# Patient Record
Sex: Male | Born: 1975 | Race: Black or African American | Hispanic: No | Marital: Married | Smoking: Former smoker
Health system: Southern US, Community
[De-identification: ages and names within clinical notes are randomized; demographics above are authoritative.]

## PROBLEM LIST (undated history)

## (undated) DIAGNOSIS — I10 Essential (primary) hypertension: Secondary | ICD-10-CM

## (undated) DIAGNOSIS — G43909 Migraine, unspecified, not intractable, without status migrainosus: Secondary | ICD-10-CM

## (undated) HISTORY — DX: Essential (primary) hypertension: I10

## (undated) HISTORY — DX: Migraine, unspecified, not intractable, without status migrainosus: G43.909

## (undated) HISTORY — PX: NO PAST SURGERIES: SHX2092

---

## 1998-01-26 ENCOUNTER — Emergency Department (HOSPITAL_COMMUNITY): Admission: EM | Admit: 1998-01-26 | Discharge: 1998-01-26 | Payer: Self-pay | Admitting: Emergency Medicine

## 1999-12-08 ENCOUNTER — Emergency Department (HOSPITAL_COMMUNITY): Admission: EM | Admit: 1999-12-08 | Discharge: 1999-12-08 | Payer: Self-pay

## 2012-06-11 ENCOUNTER — Ambulatory Visit: Payer: Self-pay | Admitting: Internal Medicine

## 2012-06-11 DIAGNOSIS — Z0289 Encounter for other administrative examinations: Secondary | ICD-10-CM

## 2013-02-06 ENCOUNTER — Ambulatory Visit (INDEPENDENT_AMBULATORY_CARE_PROVIDER_SITE_OTHER): Payer: BC Managed Care – PPO | Admitting: Internal Medicine

## 2013-02-06 ENCOUNTER — Other Ambulatory Visit (INDEPENDENT_AMBULATORY_CARE_PROVIDER_SITE_OTHER): Payer: BC Managed Care – PPO

## 2013-02-06 ENCOUNTER — Encounter: Payer: Self-pay | Admitting: Internal Medicine

## 2013-02-06 VITALS — BP 136/82 | HR 66 | Temp 98.0°F | Wt 179.0 lb

## 2013-02-06 DIAGNOSIS — Z131 Encounter for screening for diabetes mellitus: Secondary | ICD-10-CM

## 2013-02-06 DIAGNOSIS — Z0000 Encounter for general adult medical examination without abnormal findings: Secondary | ICD-10-CM

## 2013-02-06 DIAGNOSIS — R7989 Other specified abnormal findings of blood chemistry: Secondary | ICD-10-CM

## 2013-02-06 DIAGNOSIS — Z130 Encounter for screening for diseases of the blood and blood-forming organs and certain disorders involving the immune mechanism: Secondary | ICD-10-CM

## 2013-02-06 DIAGNOSIS — Z13220 Encounter for screening for lipoid disorders: Secondary | ICD-10-CM

## 2013-02-06 DIAGNOSIS — Z23 Encounter for immunization: Secondary | ICD-10-CM

## 2013-02-06 LAB — COMPREHENSIVE METABOLIC PANEL
ALT: 27 U/L (ref 0–53)
AST: 29 U/L (ref 0–37)
Alkaline Phosphatase: 45 U/L (ref 39–117)
BUN: 13 mg/dL (ref 6–23)
Creatinine, Ser: 1.1 mg/dL (ref 0.4–1.5)
Total Bilirubin: 0.4 mg/dL (ref 0.3–1.2)

## 2013-02-06 LAB — CBC
Hemoglobin: 15.6 g/dL (ref 13.0–17.0)
MCHC: 33.5 g/dL (ref 30.0–36.0)
MCV: 91.1 fl (ref 78.0–100.0)
Platelets: 229 10*3/uL (ref 150.0–400.0)
RDW: 12.8 % (ref 11.5–14.6)

## 2013-02-06 LAB — LIPID PANEL
HDL: 44.7 mg/dL (ref >39.00)
Total CHOL/HDL Ratio: 5
Triglycerides: 243 mg/dL — ABNORMAL HIGH (ref 0.0–149.0)
VLDL: 48.6 mg/dL — ABNORMAL HIGH (ref 0.0–40.0)

## 2013-02-06 LAB — HEMOGLOBIN A1C: Hgb A1c MFr Bld: 6.1 % (ref 4.6–6.5)

## 2013-02-06 NOTE — Patient Instructions (Signed)
Health Maintenance, Males A healthy lifestyle and preventative care can promote health and wellness.  Maintain regular health, dental, and eye exams.  Eat a healthy diet. Foods like vegetables, fruits, whole grains, low-fat dairy products, and lean protein foods contain the nutrients you need without too many calories. Decrease your intake of foods high in solid fats, added sugars, and salt. Get information about a proper diet from your caregiver, if necessary.  Regular physical exercise is one of the most important things you can do for your health. Most adults should get at least 150 minutes of moderate-intensity exercise (any activity that increases your heart rate and causes you to sweat) each week. In addition, most adults need muscle-strengthening exercises on 2 or more days a week.   Maintain a healthy weight. The body mass index (BMI) is a screening tool to identify possible weight problems. It provides an estimate of body fat based on height and weight. Your caregiver can help determine your BMI, and can help you achieve or maintain a healthy weight. For adults 20 years and older:  A BMI below 18.5 is considered underweight.  A BMI of 18.5 to 24.9 is normal.  A BMI of 25 to 29.9 is considered overweight.  A BMI of 30 and above is considered obese.  Maintain normal blood lipids and cholesterol by exercising and minimizing your intake of saturated fat. Eat a balanced diet with plenty of fruits and vegetables. Blood tests for lipids and cholesterol should begin at age 20 and be repeated every 5 years. If your lipid or cholesterol levels are high, you are over 50, or you are a high risk for heart disease, you may need your cholesterol levels checked more frequently.Ongoing high lipid and cholesterol levels should be treated with medicines, if diet and exercise are not effective.  If you smoke, find out from your caregiver how to quit. If you do not use tobacco, do not start.  If you  choose to drink alcohol, do not exceed 2 drinks per day. One drink is considered to be 12 ounces (355 mL) of beer, 5 ounces (148 mL) of wine, or 1.5 ounces (44 mL) of liquor.  Avoid use of street drugs. Do not share needles with anyone. Ask for help if you need support or instructions about stopping the use of drugs.  High blood pressure causes heart disease and increases the risk of stroke. Blood pressure should be checked at least every 1 to 2 years. Ongoing high blood pressure should be treated with medicines if weight loss and exercise are not effective.  If you are 45 to 38 years old, ask your caregiver if you should take aspirin to prevent heart disease.  Diabetes screening involves taking a blood sample to check your fasting blood sugar level. This should be done once every 3 years, after age 45, if you are within normal weight and without risk factors for diabetes. Testing should be considered at a younger age or be carried out more frequently if you are overweight and have at least 1 risk factor for diabetes.  Colorectal cancer can be detected and often prevented. Most routine colorectal cancer screening begins at the age of 50 and continues through age 75. However, your caregiver may recommend screening at an earlier age if you have risk factors for colon cancer. On a yearly basis, your caregiver may provide home test kits to check for hidden blood in the stool. Use of a small camera at the end of a tube,   to directly examine the colon (sigmoidoscopy or colonoscopy), can detect the earliest forms of colorectal cancer. Talk to your caregiver about this at age 9, when routine screening begins. Direct examination of the colon should be repeated every 5 to 10 years through age 63, unless early forms of pre-cancerous polyps or small growths are found.  Hepatitis C blood testing is recommended for all people born from [birth year] through 1966 and any individual with known risks for hepatitis C.  Healthy  men should no longer receive prostate-specific antigen (PSA) blood tests as part of routine cancer screening. Consult with your caregiver about prostate cancer screening.  Testicular cancer screening is not recommended for adolescents or adult males who have no symptoms. Screening includes self-exam, caregiver exam, and other screening tests. Consult with your caregiver about any symptoms you have or any concerns you have about testicular cancer.  Practice safe sex. Use condoms and avoid high-risk sexual practices to reduce the spread of sexually transmitted infections (STIs).  Use sunscreen with a sun protection factor (SPF) of 30 or greater. Apply sunscreen liberally and repeatedly throughout the day. You should seek shade when your shadow is shorter than you. Protect yourself by wearing long sleeves, pants, a wide-brimmed hat, and sunglasses year round, whenever you are outdoors.  Notify your caregiver of new moles or changes in moles, especially if there is a change in shape or color. Also notify your caregiver if a mole is larger than the size of a pencil eraser.  A one-time screening for abdominal aortic aneurysm (AAA) and surgical repair of large AAAs by sound wave imaging (ultrasonography) is recommended for ages 26 to 61 years who are current or former smokers.  Stay current with your immunizations. Document Released: 12/31/2007 Document Revised: 09/26/2011 Document Reviewed: 11/29/2010 [facility] Patient Information 2014 [place], [place]. Self-Exam Of The Testicles Young men ages 64-35 are the ones who most commonly get cancer of the testicles. About 300 young men die of this disease each year. Testicular cancer does occur in middle-aged or older men but to a lesser extent. This cancer can almost always be cured if it is found before it gets bad and if it is treated. WORDS TO KNOW:  Testicles are the 2 egg-shaped glands that make hormones and sperm in men.  The scrotum is the skin around  testicles.  Epididymis is the rope-like part that is behind and above each testis. It collects sperm made by the testis. WHICH MEN ARE AT HIGH RISK FOR CANCER OF THE TESTICLES?  Men between ages 54 to 6.  Men who are Caucasian.  Men who were born with a testicle that had not moved down into the scrotum.  Men whose testicles have gotten smaller because of an infection.  Men whose fathers or brothers have had cancer of the testicles. SYMPTOMS OF TESTICULAR CANCER  A painless swelling in one of your testicles.  A hard lump. Some lumps may be an infection.  A heavy feeling in your testicles.  An ache in your lower belly (abdomen) or groin. HOW OFTEN SHOULD I CHECK MY TESTICLES? You should check your testicles every month. HOW SHOULD I DO THIS CHECK? It is best to check your testicles right after a warm shower or bath.  Look at your testicles for any swelling. You may need to use a mirror.  Use both your hands to roll each testicle between your thumb and fingers. Feel for any lumps or changes in the size of the testicle. Press firmly. You  may find that one testicle is a little bigger than the other. This is normal.  Next, check the epididymis on each testicle. This is the part where most testicular cancers happen. It is normal for the epididymis to feel soft and uneven. When you get used to how your epididymis feels, you will be able to tell if there is any change.  WHAT IF I FIND ANY SWELLINGS OR LUMPS?  Call your doctor. Some lumps may be an infection. If the lump or swelling is a cancer, it can be treated before it gets worse.  Document Released: 09/30/2008 Document Revised: 09/26/2011 Document Reviewed: 09/30/2008 [facility] Patient Information 2014 [place], [place].

## 2013-02-06 NOTE — Addendum Note (Signed)
Addended by: Edwena Felty T on: 02/06/2013 10:02 AM   Modules accepted: Orders

## 2013-02-06 NOTE — Progress Notes (Signed)
HPI  Pt presents to the clinic today to establish care. He has not seen a doctor in about 10 years. He would like screening blood work today. Other than that he has no concerns.  Flu: never Tetanus: more than 10 years ago Eye doctor: as needed Dentist: yearly   History reviewed. No pertinent past medical history.  No current outpatient prescriptions on file.   No current facility-administered medications for this visit.    Not on File  Family History  Problem Relation Age of Onset  . Hypertension Mother   . Heart disease Paternal Uncle   . Diabetes Paternal Uncle   . Stroke Paternal Uncle   . Arthritis Maternal Grandmother   . Cancer Paternal Grandmother     breast    History   Social History  . Marital Status: Single    Spouse Name: N/A    Number of Children: N/A  . Years of Education: N/A   Occupational History  . Not on file.   Social History Main Topics  . Smoking status: Current Some Day Smoker    Types: Cigarettes  . Smokeless tobacco: Not on file  . Alcohol Use: 10.8 oz/week    18 Cans of beer per week  . Drug Use: No  . Sexually Active: Yes    Birth Control/ Protection: Condom   Other Topics Concern  . Not on file   Social History Narrative  . No narrative on file    ROS:  Constitutional: Denies fever, malaise, fatigue, headache or abrupt weight changes.  HEENT: Denies eye pain, eye redness, ear pain, ringing in the ears, wax buildup, runny nose, nasal congestion, bloody nose, or sore throat. Respiratory: Denies difficulty breathing, shortness of breath, cough or sputum production.   Cardiovascular: Denies chest pain, chest tightness, palpitations or swelling in the hands or feet.  Gastrointestinal: Denies abdominal pain, bloating, constipation, diarrhea or blood in the stool.  GU: Denies frequency, urgency, pain with urination, blood in urine, odor or discharge. Musculoskeletal: Denies decrease in range of motion, difficulty with gait, muscle  pain or joint pain and swelling.  Skin: Denies redness, rashes, lesions or ulcercations.  Neurological: Denies dizziness, difficulty with memory, difficulty with speech or problems with balance and coordination.   No other specific complaints in a complete review of systems (except as listed in HPI above).  PE:  BP 136/82  Pulse 66  Temp(Src) 98 F (36.7 C) (Oral)  Wt 179 lb (81.194 kg)  SpO2 97% Wt Readings from Last 3 Encounters:  02/06/13 179 lb (81.194 kg)    General: Appears his stated age, well developed, well nourished in NAD. HEENT: Head: normal shape and size; Eyes: sclera white, no icterus, conjunctiva pink, PERRLA and EOMs intact; Ears: Tm's gray and intact, normal light reflex; Nose: mucosa pink and moist, septum midline; Throat/Mouth: Teeth present, mucosa pink and moist, no lesions or ulcerations noted.  Neck: Normal range of motion. Neck supple, trachea midline. No massses, lumps or thyromegaly present.  Cardiovascular: Normal rate and rhythm. S1,S2 noted.  No murmur, rubs or gallops noted. No JVD or BLE edema. No carotid bruits noted. Pulmonary/Chest: Normal effort and positive vesicular breath sounds. No respiratory distress. No wheezes, rales or ronchi noted.  Abdomen: Soft and nontender. Normal bowel sounds, no bruits noted. No distention or masses noted. Liver, spleen and kidneys non palpable. Musculoskeletal: Normal range of motion. No signs of joint swelling. No difficulty with gait.  Neurological: Alert and oriented. Cranial nerves II-XII intact. Coordination  normal. +DTRs bilaterally. Psychiatric: Mood and affect normal. Behavior is normal. Judgment and thought content normal.      Assessment and Plan:  Preventative Health Maintenance:  Will give tdap today Discussed importance of getting annual flu shot Smoking cessation counseling given, material provided Will obtain screening labs  RTC in 1 year or sooner if needed

## 2013-02-07 ENCOUNTER — Telehealth: Payer: Self-pay | Admitting: *Deleted

## 2013-02-07 NOTE — Telephone Encounter (Signed)
Pt called requesting results.  Advised him of results as per result note.

## 2013-02-08 ENCOUNTER — Encounter: Payer: Self-pay | Admitting: *Deleted

## 2015-07-08 ENCOUNTER — Ambulatory Visit: Payer: Self-pay | Admitting: Family

## 2015-07-27 ENCOUNTER — Ambulatory Visit: Payer: Self-pay | Admitting: Family

## 2015-08-18 ENCOUNTER — Other Ambulatory Visit (INDEPENDENT_AMBULATORY_CARE_PROVIDER_SITE_OTHER): Payer: BLUE CROSS/BLUE SHIELD

## 2015-08-18 ENCOUNTER — Ambulatory Visit (INDEPENDENT_AMBULATORY_CARE_PROVIDER_SITE_OTHER): Payer: BLUE CROSS/BLUE SHIELD | Admitting: Family

## 2015-08-18 ENCOUNTER — Encounter: Payer: Self-pay | Admitting: Family

## 2015-08-18 VITALS — BP 136/84 | HR 69 | Temp 97.8°F | Resp 16 | Ht 74.0 in | Wt 191.0 lb

## 2015-08-18 DIAGNOSIS — Z0000 Encounter for general adult medical examination without abnormal findings: Secondary | ICD-10-CM

## 2015-08-18 DIAGNOSIS — Z720 Tobacco use: Secondary | ICD-10-CM | POA: Diagnosis not present

## 2015-08-18 DIAGNOSIS — F17209 Nicotine dependence, unspecified, with unspecified nicotine-induced disorders: Secondary | ICD-10-CM | POA: Insufficient documentation

## 2015-08-18 DIAGNOSIS — Z716 Tobacco abuse counseling: Secondary | ICD-10-CM | POA: Diagnosis not present

## 2015-08-18 LAB — COMPREHENSIVE METABOLIC PANEL
ALBUMIN: 4.5 g/dL (ref 3.5–5.2)
ALT: 28 U/L (ref 0–53)
AST: 29 U/L (ref 0–37)
Alkaline Phosphatase: 41 U/L (ref 39–117)
BUN: 13 mg/dL (ref 6–23)
CALCIUM: 9.8 mg/dL (ref 8.4–10.5)
CHLORIDE: 101 meq/L (ref 96–112)
CO2: 30 mEq/L (ref 19–32)
CREATININE: 1.07 mg/dL (ref 0.40–1.50)
GFR: 97.97 mL/min (ref >60.00)
Glucose, Bld: 103 mg/dL — ABNORMAL HIGH (ref 70–99)
POTASSIUM: 4.2 meq/L (ref 3.5–5.1)
Sodium: 136 mEq/L (ref 135–145)
Total Bilirubin: 0.4 mg/dL (ref 0.2–1.2)
Total Protein: 7.5 g/dL (ref 6.0–8.3)

## 2015-08-18 LAB — LIPID PANEL
CHOL/HDL RATIO: 4
Cholesterol: 192 mg/dL (ref 0–200)
HDL: 44.6 mg/dL (ref >39.00)
LDL CALC: 115 mg/dL — AB (ref 0–99)
NonHDL: 147.37
TRIGLYCERIDES: 162 mg/dL — AB (ref 0.0–149.0)
VLDL: 32.4 mg/dL (ref 0.0–40.0)

## 2015-08-18 LAB — CBC
HEMATOCRIT: 45.5 % (ref 39.0–52.0)
Hemoglobin: 15 g/dL (ref 13.0–17.0)
MCHC: 33 g/dL (ref 30.0–36.0)
MCV: 89.8 fl (ref 78.0–100.0)
PLATELETS: 242 10*3/uL (ref 150.0–400.0)
RBC: 5.07 Mil/uL (ref 4.22–5.81)
RDW: 13.1 % (ref 11.5–15.5)
WBC: 7.7 10*3/uL (ref 4.0–10.5)

## 2015-08-18 LAB — PSA: PSA: 1.06 ng/mL (ref 0.10–4.00)

## 2015-08-18 LAB — TSH: TSH: 0.77 u[IU]/mL (ref 0.35–4.50)

## 2015-08-18 NOTE — Patient Instructions (Signed)
Thank you for choosing [facility].  Summary/Instructions:  Please stop by the lab on the basement level of the building for your blood work. Your results will be released to [place] (or called to you) after review, usually within 72 hours after test completion. If any changes need to be made, you will be notified at that same time.  Health Maintenance, Male A healthy lifestyle and preventative care can promote health and wellness.  Maintain regular health, dental, and eye exams.  Eat a healthy diet. Foods like vegetables, fruits, whole grains, low-fat dairy products, and lean protein foods contain the nutrients you need and are low in calories. Decrease your intake of foods high in solid fats, added sugars, and salt. Get information about a proper diet from your health care provider, if necessary.  Regular physical exercise is one of the most important things you can do for your health. Most adults should get at least 150 minutes of moderate-intensity exercise (any activity that increases your heart rate and causes you to sweat) each week. In addition, most adults need muscle-strengthening exercises on 2 or more days a week.   Maintain a healthy weight. The body mass index (BMI) is a screening tool to identify possible weight problems. It provides an estimate of body fat based on height and weight. Your health care provider can find your BMI and can help you achieve or maintain a healthy weight. For males 20 years and older:  A BMI below 18.5 is considered underweight.  A BMI of 18.5 to 24.9 is normal.  A BMI of 25 to 29.9 is considered overweight.  A BMI of 30 and above is considered obese.  Maintain normal blood lipids and cholesterol by exercising and minimizing your intake of saturated fat. Eat a balanced diet with plenty of fruits and vegetables. Blood tests for lipids and cholesterol should begin at age 34 and be repeated every 5 years. If your lipid or cholesterol levels are  high, you are over age 50, or you are at high risk for heart disease, you may need your cholesterol levels checked more frequently.Ongoing high lipid and cholesterol levels should be treated with medicines if diet and exercise are not working.  If you smoke, find out from your health care provider how to quit. If you do not use tobacco, do not start.  Lung cancer screening is recommended for adults aged 34-80 years who are at high risk for developing lung cancer because of a history of smoking. A yearly low-dose CT scan of the lungs is recommended for people who have at least a 30-pack-year history of smoking and are current smokers or have quit within the past 15 years. A pack year of smoking is smoking an average of 1 pack of cigarettes a day for 1 year (for example, a 30-pack-year history of smoking could mean smoking 1 pack a day for 30 years or 2 packs a day for 15 years). Yearly screening should continue until the smoker has stopped smoking for at least 15 years. Yearly screening should be stopped for people who develop a health problem that would prevent them from having lung cancer treatment.  If you choose to drink alcohol, do not have more than 2 drinks per day. One drink is considered to be 12 oz (360 mL) of beer, 5 oz (150 mL) of wine, or 1.5 oz (45 mL) of liquor.  Avoid the use of street drugs. Do not share needles with anyone. Ask for help if you need  support or instructions about stopping the use of drugs.  High blood pressure causes heart disease and increases the risk of stroke. High blood pressure is more likely to develop in:  People who have blood pressure in the end of the normal range (100-139/85-89 mm Hg).  People who are overweight or obese.  People who are African American.  If you are 2-65 years of age, have your blood pressure checked every 3-5 years. If you are 45 years of age or older, have your blood pressure checked every year. You should have your blood pressure  measured twice--once when you are at a hospital or clinic, and once when you are not at a hospital or clinic. Record the average of the two measurements. To check your blood pressure when you are not at a hospital or clinic, you can use:  An automated blood pressure machine at a pharmacy.  A home blood pressure monitor.  If you are 37-22 years old, ask your health care provider if you should take aspirin to prevent heart disease.  Diabetes screening involves taking a blood sample to check your fasting blood sugar level. This should be done once every 3 years after age 42 if you are at a normal weight and without risk factors for diabetes. Testing should be considered at a younger age or be carried out more frequently if you are overweight and have at least 1 risk factor for diabetes.  Colorectal cancer can be detected and often prevented. Most routine colorectal cancer screening begins at the age of 46 and continues through age 38. However, your health care provider may recommend screening at an earlier age if you have risk factors for colon cancer. On a yearly basis, your health care provider may provide home test kits to check for hidden blood in the stool. A small camera at the end of a tube may be used to directly examine the colon (sigmoidoscopy or colonoscopy) to detect the earliest forms of colorectal cancer. Talk to your health care provider about this at age 42 when routine screening begins. A direct exam of the colon should be repeated every 5-10 years through age 19, unless early forms of precancerous polyps or small growths are found.  People who are at an increased risk for hepatitis B should be screened for this virus. You are considered at high risk for hepatitis B if:  You were born in a country where hepatitis B occurs often. Talk with your health care provider about which countries are considered high risk.  Your parents were born in a high-risk country and you have not received a  shot to protect against hepatitis B (hepatitis B vaccine).  You have HIV or AIDS.  You use needles to inject street drugs.  You live with, or have sex with, someone who has hepatitis B.  You are a man who has sex with other men (MSM).  You get hemodialysis treatment.  You take certain medicines for conditions like cancer, organ transplantation, and autoimmune conditions.  Hepatitis C blood testing is recommended for all people born from [birth year] through 1966 and any individual with known risk factors for hepatitis C.  Healthy men should no longer receive prostate-specific antigen (PSA) blood tests as part of routine cancer screening. Talk to your health care provider about prostate cancer screening.  Testicular cancer screening is not recommended for adolescents or adult males who have no symptoms. Screening includes self-exam, a health care provider exam, and other screening tests. Consult with your  health care provider about any symptoms you have or any concerns you have about testicular cancer.  Practice safe sex. Use condoms and avoid high-risk sexual practices to reduce the spread of sexually transmitted infections (STIs).  You should be screened for STIs, including gonorrhea and chlamydia if:  You are sexually active and are younger than 24 years.  You are older than 24 years, and your health care provider tells you that you are at risk for this type of infection.  Your sexual activity has changed since you were last screened, and you are at an increased risk for chlamydia or gonorrhea. Ask your health care provider if you are at risk.  If you are at risk of being infected with HIV, it is recommended that you take a prescription medicine daily to prevent HIV infection. This is called pre-exposure prophylaxis (PrEP). You are considered at risk if:  You are a man who has sex with other men (MSM).  You are a heterosexual man who is sexually active with multiple partners.  You take  drugs by injection.  You are sexually active with a partner who has HIV.  Talk with your health care provider about whether you are at high risk of being infected with HIV. If you choose to begin PrEP, you should first be tested for HIV. You should then be tested every 3 months for as long as you are taking PrEP.  Use sunscreen. Apply sunscreen liberally and repeatedly throughout the day. You should seek shade when your shadow is shorter than you. Protect yourself by wearing long sleeves, pants, a wide-brimmed hat, and sunglasses year round whenever you are outdoors.  Tell your health care provider of new moles or changes in moles, especially if there is a change in shape or color. Also, tell your health care provider if a mole is larger than the size of a pencil eraser.  A one-time screening for abdominal aortic aneurysm (AAA) and surgical repair of large AAAs by ultrasound is recommended for men aged 44-75 years who are current or former smokers.  Stay current with your vaccines (immunizations).   This information is not intended to replace advice given to you by your health care provider. Make sure you discuss any questions you have with your health care provider.   Document Released: 12/31/2007 Document Revised: 07/25/2014 Document Reviewed: 11/29/2010 Elsevier Interactive Patient Education 2016 [employer].  Smoking Cessation, Tips for Success If you are ready to quit smoking, congratulations! You have chosen to help yourself be healthier. Cigarettes bring nicotine, tar, carbon monoxide, and other irritants into your body. Your lungs, heart, and blood vessels will be able to work better without these poisons. There are many different ways to quit smoking. Nicotine gum, nicotine patches, a nicotine inhaler, or nicotine nasal spray can help with physical craving. Hypnosis, support groups, and medicines help break the habit of smoking. WHAT THINGS CAN I DO TO MAKE QUITTING EASIER?  Here are  some tips to help you quit for good:  Pick a date when you will quit smoking completely. Tell all of your friends and family about your plan to quit on that date.  Do not try to slowly cut down on the number of cigarettes you are smoking. Pick a quit date and quit smoking completely starting on that day.  Throw away all cigarettes.   Clean and remove all ashtrays from your home, work, and car.  On a card, write down your reasons for quitting. Carry the card with  you and read it when you get the urge to smoke.  Cleanse your body of nicotine. Drink enough water and fluids to keep your urine clear or pale yellow. Do this after quitting to flush the nicotine from your body.  Learn to predict your moods. Do not let a bad situation be your excuse to have a cigarette. Some situations in your life might tempt you into wanting a cigarette.  Never have "just one" cigarette. It leads to wanting another and another. Remind yourself of your decision to quit.  Change habits associated with smoking. If you smoked while driving or when feeling stressed, try other activities to replace smoking. Stand up when drinking your coffee. Brush your teeth after eating. Sit in a different chair when you read the paper. Avoid alcohol while trying to quit, and try to drink fewer caffeinated beverages. Alcohol and caffeine may urge you to smoke.  Avoid foods and drinks that can trigger a desire to smoke, such as sugary or spicy foods and alcohol.  Ask people who smoke not to smoke around you.  Have something planned to do right after eating or having a cup of coffee. For example, plan to take a walk or exercise.  Try a relaxation exercise to calm you down and decrease your stress. Remember, you may be tense and nervous for the first 2 weeks after you quit, but this will pass.  Find new activities to keep your hands busy. Play with a pen, coin, or rubber band. Doodle or draw things on paper.  Brush your teeth right  after eating. This will help cut down on the craving for the taste of tobacco after meals. You can also try mouthwash.   Use oral substitutes in place of cigarettes. Try using lemon drops, carrots, cinnamon sticks, or chewing gum. Keep them handy so they are available when you have the urge to smoke.  When you have the urge to smoke, try deep breathing.  Designate your home as a nonsmoking area.  If you are a heavy smoker, ask your health care provider about a prescription for nicotine chewing gum. It can ease your withdrawal from nicotine.  Reward yourself. Set aside the cigarette money you save and buy yourself something nice.  Look for support from others. Join a support group or smoking cessation program. Ask someone at home or at work to help you with your plan to quit smoking.  Always ask yourself, "Do I need this cigarette or is this just a reflex?" Tell yourself, "Today, I choose not to smoke," or "I do not want to smoke." You are reminding yourself of your decision to quit.  Do not replace cigarette smoking with electronic cigarettes (commonly called e-cigarettes). The safety of e-cigarettes is unknown, and some may contain harmful chemicals.  If you relapse, do not give up! Plan ahead and think about what you will do the next time you get the urge to smoke. HOW WILL I FEEL WHEN I QUIT SMOKING? You may have symptoms of withdrawal because your body is used to nicotine (the addictive substance in cigarettes). You may crave cigarettes, be irritable, feel very hungry, cough often, get headaches, or have difficulty concentrating. The withdrawal symptoms are only temporary. They are strongest when you first quit but will go away within 10-14 days. When withdrawal symptoms occur, stay in control. Think about your reasons for quitting. Remind yourself that these are signs that your body is healing and getting used to being without cigarettes. Remember that  withdrawal symptoms are easier to  treat than the major diseases that smoking can cause.  Even after the withdrawal is over, expect periodic urges to smoke. However, these cravings are generally short lived and will go away whether you smoke or not. Do not smoke! WHAT RESOURCES ARE AVAILABLE TO HELP ME QUIT SMOKING? Your health care provider can direct you to community resources or hospitals for support, which may include:  Group support.  Education.  Hypnosis.  Therapy.   This information is not intended to replace advice given to you by your health care provider. Make sure you discuss any questions you have with your health care provider.   Document Released: 04/01/2004 Document Revised: 07/25/2014 Document Reviewed: 12/20/2012 Elsevier Interactive Patient Education 2016 [employer].  Steps to Quit Smoking  Smoking tobacco can be harmful to your health and can affect almost every organ in your body. Smoking puts you, and those around you, at risk for developing many serious chronic diseases. Quitting smoking is difficult, but it is one of the best things that you can do for your health. It is never too late to quit. WHAT ARE THE BENEFITS OF QUITTING SMOKING? When you quit smoking, you lower your risk of developing serious diseases and conditions, such as:  Lung cancer or lung disease, such as COPD.  Heart disease.  Stroke.  Heart attack.  Infertility.  Osteoporosis and bone fractures. Additionally, symptoms such as coughing, wheezing, and shortness of breath may get better when you quit. You may also find that you get sick less often because your body is stronger at fighting off colds and infections. If you are pregnant, quitting smoking can help to reduce your chances of having a baby of low birth weight. HOW DO I GET READY TO QUIT? When you decide to quit smoking, create a plan to make sure that you are successful. Before you quit:  Pick a date to quit. Set a date within the next two weeks to give you time  to prepare.  Write down the reasons why you are quitting. Keep this list in places where you will see it often, such as on your bathroom mirror or in your car or wallet.  Identify the people, places, things, and activities that make you want to smoke (triggers) and avoid them. Make sure to take these actions:  Throw away all cigarettes at home, at work, and in your car.  Throw away smoking accessories, such as Scientist, research (medical).  Clean your car and make sure to empty the ashtray.  Clean your home, including curtains and carpets.  Tell your family, friends, and coworkers that you are quitting. Support from your loved ones can make quitting easier.  Talk with your health care provider about your options for quitting smoking.  Find out what treatment options are covered by your health insurance. WHAT STRATEGIES CAN I USE TO QUIT SMOKING?  Talk with your healthcare provider about different strategies to quit smoking. Some strategies include:  Quitting smoking altogether instead of gradually lessening how much you smoke over a period of time. Research shows that quitting "cold [place]" is more successful than gradually quitting.  Attending in-person counseling to help you build problem-solving skills. You are more likely to have success in quitting if you attend several counseling sessions. Even short sessions of 10 minutes can be effective.  Finding resources and support systems that can help you to quit smoking and remain smoke-free after you quit. These resources are most helpful when you  use them often. They can include:  Online chats with a Social worker.  Telephone quitlines.  Printed Furniture conservator/restorer.  Support groups or group counseling.  Text messaging programs.  Mobile phone applications.  Taking medicines to help you quit smoking. (If you are pregnant or breastfeeding, talk with your health care provider first.) Some medicines contain nicotine and some do not. Both  types of medicines help with cravings, but the medicines that include nicotine help to relieve withdrawal symptoms. Your health care provider may recommend:  Nicotine patches, gum, or lozenges.  Nicotine inhalers or sprays.  Non-nicotine medicine that is taken by mouth. Talk with your health care provider about combining strategies, such as taking medicines while you are also receiving in-person counseling. Using these two strategies together makes you more likely to succeed in quitting than if you used either strategy on its own. If you are pregnant or breastfeeding, talk with your health care provider about finding counseling or other support strategies to quit smoking. Do not take medicine to help you quit smoking unless told to do so by your health care provider. WHAT THINGS CAN I DO TO MAKE IT EASIER TO QUIT? Quitting smoking might feel overwhelming at first, but there is a lot that you can do to make it easier. Take these important actions:  Reach out to your family and friends and ask that they support and encourage you during this time. Call telephone quitlines, reach out to support groups, or work with a counselor for support.  Ask people who smoke to avoid smoking around you.  Avoid places that trigger you to smoke, such as bars, parties, or smoke-break areas at work.  Spend time around people who do not smoke.  Lessen stress in your life, because stress can be a smoking trigger for some people. To lessen stress, try:  Exercising regularly.  Deep-breathing exercises.  Yoga.  Meditating.  Performing a body scan. This involves closing your eyes, scanning your body from head to toe, and noticing which parts of your body are particularly tense. Purposefully relax the muscles in those areas.  Download or purchase mobile phone or tablet apps (applications) that can help you stick to your quit plan by providing reminders, tips, and encouragement. There are many free apps, such as  QuitGuide from the [employer] Office manager for Disease Control and Prevention). You can find other support for quitting smoking (smoking cessation) through smokefree.gov and other websites. HOW WILL I FEEL WHEN I QUIT SMOKING? Within the first 24 hours of quitting smoking, you may start to feel some withdrawal symptoms. These symptoms are usually most noticeable 2-3 days after quitting, but they usually do not last beyond 2-3 weeks. Changes or symptoms that you might experience include:  Mood swings.  Restlessness, anxiety, or irritation.  Difficulty concentrating.  Dizziness.  Strong cravings for sugary foods in addition to nicotine.  Mild weight gain.  Constipation.  Nausea.  Coughing or a sore throat.  Changes in how your medicines work in your body.  A depressed mood.  Difficulty sleeping (insomnia). After the first 2-3 weeks of quitting, you may start to notice more positive results, such as:  Improved sense of smell and taste.  Decreased coughing and sore throat.  Slower heart rate.  Lower blood pressure.  Clearer skin.  The ability to breathe more easily.  Fewer sick days. Quitting smoking is very challenging for most people. Do not get discouraged if you are not successful the first time. Some people need to make many  attempts to quit before they achieve long-term success. Do your best to stick to your quit plan, and talk with your health care provider if you have any questions or concerns.   This information is not intended to replace advice given to you by your health care provider. Make sure you discuss any questions you have with your health care provider.   Document Released: 06/28/2001 Document Revised: 11/18/2014 Document Reviewed: 11/18/2014 Elsevier Interactive Patient Education [employer].

## 2015-08-18 NOTE — Progress Notes (Signed)
Pre visit review using our clinic review tool, if applicable. No additional management support is needed unless otherwise documented below in the visit note. 

## 2015-08-18 NOTE — Assessment & Plan Note (Signed)
1) Anticipatory Guidance: Discussed importance of wearing a seatbelt while driving and not texting while driving; changing batteries in smoke detector at least once annually; wearing suntan lotion when outside; eating a balanced and moderate diet; getting physical activity at least 30 minutes per day.  2) Immunizations / Screenings / Labs:  Declines flu shot. All other immunizations are up-to-date per recommendations. Due for a vision screen encouraged to be completed independently. Due for a prostate screening secondary to first-degree relative with prostate cancer. Obtain PSA. All other screenings are up-to-date per recommendations. Obtain CBC, CMET, Lipid profile and TSH.   Overall well exam with risk factors for cardiovascular disease including continues tobacco use and increased alcohol intake. Discussed tobacco cessation, resources, and methods use for tobacco cessation including gums, patches, and medications. Counseled for approximately 5 minutes regarding tobacco cessation. Patient will contemplate cessation. Emphasize importance of continued intake of a moderate, varied, and balanced nutritional intake that emphasizes nutritionally dense foods. Recommend increasing physical activity to 30 minutes daily outside of work. He is of good body weight. Continue healthy lifestyle choices and behaviors. Follow-up prevention exam in 1 year. Follow-up office visit pending blood work results.

## 2015-08-18 NOTE — Assessment & Plan Note (Signed)
Discussed tobacco cessation, resources, and methods use for tobacco cessation including gums, patches, and medications. Counseled for approximately 5 minutes regarding tobacco cessation. Patient will contemplate cessation.

## 2015-08-18 NOTE — Progress Notes (Signed)
Subjective:    Patient ID: Donald Glass, male    DOB: October 03, 1975, 40 y.o.   MRN: [id number]  Chief Complaint  Patient presents with  . Establish Care    CPE, not fasting    HPI:  Donald Glass is a 40 y.o. male who presents today for an annual wellness visit.   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    2) Preventative Exams / Immunizations:  Dental -- Up to date  Vision -- Due for exam    Health Maintenance  Topic Date Due  . HIV Screening  09/05/1989  . INFLUENZA VACCINE  04/01/2016 (Originally 02/16/2015)  . TETANUS/TDAP  02/07/2023    Immunization History  Administered Date(s) Administered  . Tdap 02/06/2013   No Known Allergies   No outpatient prescriptions prior to visit.   No facility-administered medications prior to visit.     History reviewed. No pertinent past medical history.   History reviewed. No pertinent past surgical history.   Family History  Problem Relation Age of Onset  . Hypertension Mother   . Heart disease Paternal Uncle   . Diabetes Paternal Uncle   . Stroke Paternal Uncle   . Arthritis Maternal Grandmother   . Cancer Paternal Grandmother     breast  . Prostate cancer Father      Social History   Social History  . Marital Status: Married    Spouse Name: N/A  . Number of Children: 4  . Years of Education: 14   Occupational History  . Quality Technician     Social History Main Topics  . Smoking status: Current Some Day Smoker -- 1.00 packs/day for 20 years    Types: Cigarettes  . Smokeless tobacco: Never Used  . Alcohol Use: 10.8 oz/week    18 Cans of beer per week  . Drug Use: 1.00 per week    Special: Marijuana  . Sexual Activity: Yes    Birth Control/ Protection: Condom   Other Topics Concern    . Not on file   Social History Narrative   Fun: Watch football, movies, video games    Review of Systems  Constitutional: Denies fever, chills, fatigue, or significant weight gain/loss. HENT: Head: Denies headache or neck pain Ears: Denies changes in hearing, ringing in ears, earache, drainage Nose: Denies discharge, stuffiness, itching, nosebleed, sinus pain Throat: Denies sore throat, hoarseness, dry mouth, sores, thrush Eyes: Denies loss/changes in vision, pain, redness, blurry/double vision, flashing lights Cardiovascular: Denies chest pain/discomfort, tightness, palpitations, shortness of breath with activity, difficulty lying down, swelling, sudden awakening with shortness of breath Respiratory: Denies shortness of breath, cough, sputum production, wheezing Gastrointestinal: Denies dysphasia, heartburn, change in appetite, nausea, change in bowel habits, rectal bleeding, constipation, diarrhea, yellow skin or eyes Genitourinary: Denies frequency, urgency, burning/pain, blood in urine, incontinence, change in urinary strength. Musculoskeletal: Denies muscle/joint pain, stiffness, back pain, redness or swelling of joints, trauma Skin: Denies rashes, lumps, itching, dryness, color changes, or hair/nail changes Neurological: Denies dizziness, fainting, seizures, weakness, numbness, tingling, tremor Psychiatric - Denies nervousness, stress, depression or memory loss Endocrine: Denies heat or cold intolerance, sweating, frequent urination, excessive thirst, changes in appetite Hematologic: Denies ease of bruising or bleeding     Objective:    BP 136/84 mmHg  Pulse 69  Temp(Src) 97.8 F (36.6 C) (Oral)  Resp 16  Ht 6' 2"  (1.88 m)  Wt 191 lb (86.637 kg)  BMI 24.51 kg/m2  SpO2 97% Nursing note and vital signs reviewed.  Physical Exam  Constitutional: He is oriented to person, place, and time. He appears well-developed and well-nourished.  HENT:  Head: Normocephalic.  Right  Ear: Hearing, tympanic membrane, external ear and ear canal normal.  Left Ear: Hearing, tympanic membrane, external ear and ear canal normal.  Nose: Nose normal.  Mouth/Throat: Uvula is midline, oropharynx is clear and moist and mucous membranes are normal.  Eyes: Conjunctivae and EOM are normal. Pupils are equal, round, and reactive to light.  Neck: Neck supple. No JVD present. No tracheal deviation present. No thyromegaly present.  Cardiovascular: Normal rate, regular rhythm, normal heart sounds and intact distal pulses.   Pulmonary/Chest: Effort normal and breath sounds normal.  Abdominal: Soft. Bowel sounds are normal. He exhibits no distension and no mass. There is no tenderness. There is no rebound and no guarding.  Musculoskeletal: Normal range of motion. He exhibits no edema or tenderness.  Lymphadenopathy:    He has no cervical adenopathy.  Neurological: He is alert and oriented to person, place, and time. He has normal reflexes. No cranial nerve deficit. He exhibits normal muscle tone. Coordination normal.  Skin: Skin is warm and dry.  Psychiatric: He has a normal mood and affect. His behavior is normal. Judgment and thought content normal.       Assessment & Plan:   Problem List Items Addressed This Visit      Other   Routine general medical examination at a health care facility - Primary    1) Anticipatory Guidance: Discussed importance of wearing a seatbelt while driving and not texting while driving; changing batteries in smoke detector at least once annually; wearing suntan lotion when outside; eating a balanced and moderate diet; getting physical activity at least 30 minutes per day.  2) Immunizations / Screenings / Labs:  Declines flu shot. All other immunizations are up-to-date per recommendations. Due for a vision screen encouraged to be completed independently. Due for a prostate screening secondary to first-degree relative with prostate cancer. Obtain PSA. All other  screenings are up-to-date per recommendations. Obtain CBC, CMET, Lipid profile and TSH.   Overall well exam with risk factors for cardiovascular disease including continues tobacco use and increased alcohol intake. Discussed tobacco cessation, resources, and methods use for tobacco cessation including gums, patches, and medications. Counseled for approximately 5 minutes regarding tobacco cessation. Patient will contemplate cessation. Emphasize importance of continued intake of a moderate, varied, and balanced nutritional intake that emphasizes nutritionally dense foods. Recommend increasing physical activity to 30 minutes daily outside of work. He is of good body weight. Continue healthy lifestyle choices and behaviors. Follow-up prevention exam in 1 year. Follow-up office visit pending blood work results.       Relevant Orders   Comprehensive metabolic panel (Completed)   CBC (Completed)   Lipid panel (Completed)   TSH (Completed)   PSA (Completed)   Tobacco use disorder, continuous    Discussed tobacco cessation, resources, and methods use for tobacco cessation including gums, patches, and medications. Counseled for approximately 5 minutes regarding tobacco cessation. Patient will contemplate cessation.       Other Visit Diagnoses    Tobacco abuse counseling

## 2015-08-19 ENCOUNTER — Encounter: Payer: Self-pay | Admitting: Family

## 2016-08-09 ENCOUNTER — Emergency Department (HOSPITAL_COMMUNITY): Payer: BLUE CROSS/BLUE SHIELD

## 2016-08-09 ENCOUNTER — Telehealth: Payer: Self-pay | Admitting: Family

## 2016-08-09 ENCOUNTER — Emergency Department (HOSPITAL_COMMUNITY)
Admission: EM | Admit: 2016-08-09 | Discharge: 2016-08-09 | Disposition: A | Discharge status: Home / Self Care | Payer: BLUE CROSS/BLUE SHIELD | Attending: Emergency Medicine | Admitting: Emergency Medicine

## 2016-08-09 ENCOUNTER — Encounter (HOSPITAL_COMMUNITY): Payer: Self-pay | Admitting: Emergency Medicine

## 2016-08-09 DIAGNOSIS — F17210 Nicotine dependence, cigarettes, uncomplicated: Secondary | ICD-10-CM | POA: Diagnosis not present

## 2016-08-09 DIAGNOSIS — I10 Essential (primary) hypertension: Secondary | ICD-10-CM | POA: Insufficient documentation

## 2016-08-09 DIAGNOSIS — G43001 Migraine without aura, not intractable, with status migrainosus: Secondary | ICD-10-CM | POA: Insufficient documentation

## 2016-08-09 DIAGNOSIS — R40241 Glasgow coma scale score 13-15, unspecified time: Secondary | ICD-10-CM | POA: Diagnosis not present

## 2016-08-09 DIAGNOSIS — Z79899 Other long term (current) drug therapy: Secondary | ICD-10-CM | POA: Insufficient documentation

## 2016-08-09 DIAGNOSIS — R51 Headache: Secondary | ICD-10-CM | POA: Diagnosis present

## 2016-08-09 DIAGNOSIS — R0789 Other chest pain: Secondary | ICD-10-CM | POA: Diagnosis not present

## 2016-08-09 LAB — CBC
HEMATOCRIT: 44.9 % (ref 39.0–52.0)
Hemoglobin: 15.2 g/dL (ref 13.0–17.0)
MCH: 29.8 pg (ref 26.0–34.0)
MCHC: 33.9 g/dL (ref 30.0–36.0)
MCV: 88 fL (ref 78.0–100.0)
Platelets: 239 10*3/uL (ref 150–400)
RBC: 5.1 MIL/uL (ref 4.22–5.81)
RDW: 12.3 % (ref 11.5–15.5)
WBC: 7.6 10*3/uL (ref 4.0–10.5)

## 2016-08-09 LAB — BASIC METABOLIC PANEL
ANION GAP: 8 (ref 5–15)
BUN: 10 mg/dL (ref 6–20)
CALCIUM: 9.6 mg/dL (ref 8.9–10.3)
CO2: 25 mmol/L (ref 22–32)
Chloride: 103 mmol/L (ref 101–111)
Creatinine, Ser: 1.15 mg/dL (ref 0.61–1.24)
GLUCOSE: 106 mg/dL — AB (ref 65–99)
POTASSIUM: 3.7 mmol/L (ref 3.5–5.1)
Sodium: 136 mmol/L (ref 135–145)

## 2016-08-09 LAB — I-STAT TROPONIN, ED
TROPONIN I, POC: 0 ng/mL (ref 0.00–0.08)
Troponin i, poc: 0.01 ng/mL (ref 0.00–0.08)

## 2016-08-09 MED ORDER — HYDROCHLOROTHIAZIDE 25 MG PO TABS: 25.0000 mg | ORAL_TABLET | Freq: Every day | ORAL | Status: DC

## 2016-08-09 MED ORDER — LISINOPRIL 10 MG PO TABS
10.0000 mg | ORAL_TABLET | Freq: Once | ORAL | Status: AC
  Administered 2016-08-09: 10 mg via ORAL
  Filled 2016-08-09: qty 1

## 2016-08-09 MED ORDER — LISINOPRIL 10 MG PO TABS: 10.0000 mg | ORAL_TABLET | Freq: Every day | ORAL | 0 refills | Status: DC

## 2016-08-09 NOTE — Telephone Encounter (Signed)
Patient Name: Donald Glass DOB: [birth year] Initial Comment husband is having some numbness in left foot, companied that last night his vision got blurry, didnt feel good, headache, numbness for about 30 from head to hands on left side. took quite few ibuprofen to help, not the first time he has made comment on these symptoms Nurse Assessment Nurse: Markus Daft, RN, Sherre Poot Date/Time (Eastern Time): 08/09/2016 8:59:37 AM Confirm and document reason for call. If symptomatic, describe symptoms. ---Caller states that husband is having some numbness in left foot/ leg/arm around 3 am. Lasted 30 - 40 min. Headache and then Blurry vision started 30 min. prior to the numbness. Took quite few ibuprofen to help, not the first time he has made comment on these symptoms. Does the patient have any new or worsening symptoms? ---Yes Will a triage be completed? ---Yes Related visit to physician within the last 2 weeks? ---No Does the PT have any chronic conditions? (i.e. diabetes, asthma, etc.) ---No Is this a behavioral health or substance abuse call? ---No Guidelines Guideline Title Affirmed Question Affirmed Notes Neurologic Deficit [1] Numbness (i.e., loss of sensation) of the face, arm / hand, or leg / foot on one side of the body AND [2] sudden onset AND [3] brief (now gone) Final Disposition User Go to ED Now (or PCP triage) Markus Daft, RN, Sherre Poot Comments Nothing available at offices. They were advised to go to ER or UC. Referrals REFERRED TO PCP OFFICE Disagree/Comply: Comply

## 2016-08-09 NOTE — ED Provider Notes (Signed)
Patient presently states that all symptoms that he initially presented with i.e. headache visual changes and numbness have resolved with the exception of numbness of the plantar surface of his left foot. Patient is alert Glasgow Coma Score 15 HEENT exam no facial asymmetry neurologic exam cranial nerves II through XII grossly intact motor strength 5 over 5 overall DTRs symmetric bilaterally at knee jerk ankle jerk and biceps toes downward going bilaterally   Donald Dakin, MD 08/09/16 1446

## 2016-08-09 NOTE — ED Provider Notes (Signed)
[facility] DEPT Provider Note   CSN: [id number] Arrival date & time: 08/09/16  1001     History   Chief Complaint Chief Complaint  Patient presents with  . Numbness  . Shoulder Pain  . Chest Pain    HPI KEVYN BOQUET is a 41 y.o. male who presents with c/o abnormal neurologic sxs and chest tightness. Patient works 3rd shift and states that at 3:00 am he got a slight,mild headache and blurry vision in both eyes which he also describes as iridescent. The patient took 3, 225m ibuprofen and his headache resolved but about 30 min later he developed left sided chest tightness lasting about 10 min and full body left sided numbness. He also noticed that his temperature sensation was impaired when he picked up a cold soda with his left hand and could not feel that it was cold. He states that all of his sxs are resolved and he denies any other neurologic complaints. He states that the total of his sxs lasted about 90 min. He has a hx of 2 previous head injuries. He is noted to be hypertensive and denies any history of the same.  HPI  History reviewed. No pertinent past medical history.  Patient Active Problem List   Diagnosis Date Noted  . Routine general medical examination at a health care facility 08/18/2015  . Tobacco use disorder, continuous 08/18/2015    History reviewed. No pertinent surgical history.     Home Medications    Prior to Admission medications   Not on File    Family History Family History  Problem Relation Age of Onset  . Hypertension Mother   . Arthritis Maternal Grandmother   . Cancer Paternal Grandmother     breast  . Prostate cancer Father   . Heart disease Paternal Uncle   . Diabetes Paternal Uncle   . Stroke Paternal Uncle     Social History Social History  Substance Use Topics  . Smoking status: Current Some Day Smoker    Packs/day: 1.00    Years: 20.00    Types: Cigarettes  . Smokeless tobacco: Never Used  . Alcohol use 10.8  oz/week    18 Cans of beer per week     Allergies   Patient has no known allergies.   Review of Systems Review of Systems Ten systems reviewed and are negative for acute change, except as noted in the HPI.    Physical Exam Updated Vital Signs BP 150/84 (BP Location: Left Arm)   Pulse 72   Temp 98.9 F (37.2 C)   Resp 18   Ht 6' 2"  (1.88 m)   Wt 86.6 kg   SpO2 100%   BMI 24.52 kg/m   Physical Exam  Constitutional: He is oriented to person, place, and time. He appears well-developed and well-nourished. No distress.  HENT:  Head: Normocephalic and atraumatic.  Mouth/Throat: Oropharynx is clear and moist.  Eyes: Conjunctivae and EOM are normal. Pupils are equal, round, and reactive to light. No scleral icterus.  No horizontal, vertical or rotational nystagmus  Neck: Normal range of motion. Neck supple.  Full active and passive ROM without pain No midline or paraspinal tenderness No nuchal rigidity or meningeal signs  Cardiovascular: Normal rate, regular rhythm and intact distal pulses.   Pulmonary/Chest: Effort normal and breath sounds normal. No respiratory distress. He has no wheezes. He has no rales.  Abdominal: Soft. Bowel sounds are normal. There is no tenderness. There is no rebound and no guarding.  Musculoskeletal: Normal range of motion.  Lymphadenopathy:    He has no cervical adenopathy.  Neurological: He is alert and oriented to person, place, and time. No cranial nerve deficit. He exhibits normal muscle tone. Coordination normal.  Mental Status:  Alert, oriented, thought content appropriate. Speech fluent without evidence of aphasia. Able to follow 2 step commands without difficulty.  Cranial Nerves:  II:  Peripheral visual fields grossly normal, pupils equal, round, reactive to light III,IV, VI: ptosis not present, extra-ocular motions intact bilaterally  V,VII: smile symmetric, facial light touch sensation equal VIII: hearing grossly normal bilaterally    IX,X: midline uvula rise  XI: bilateral shoulder shrug equal and strong XII: midline tongue extension  Motor:  5/5 in upper and lower extremities bilaterally including strong and equal grip strength and dorsiflexion/plantar flexion Sensory: Pinprick and light touch normal in all extremities.  Cerebellar: normal finger-to-nose with bilateral upper extremities Gait: normal gait and balance CV: distal pulses palpable throughout   Skin: Skin is warm and dry. No rash noted. He is not diaphoretic.  Psychiatric: He has a normal mood and affect. His behavior is normal. Judgment and thought content normal.  Nursing note and vitals reviewed.    ED Treatments / Results  Labs (all labs ordered are listed, but only abnormal results are displayed) Labs Reviewed  BASIC METABOLIC PANEL - Abnormal; Notable for the following:       Result Value   Glucose, Bld 106 (*)    All other components within normal limits  CBC  I-STAT TROPOININ, ED    EKG  EKG Interpretation None       Radiology Dg Chest 2 View  Result Date: 08/09/2016 CLINICAL DATA:  Left-sided chest pain and left-sided numbness of the body EXAM: CHEST  2 VIEW COMPARISON:  None in PACs FINDINGS: The lungs are mildly hyperinflated. There is linear increased density that appears to lie at the right lung base posteriorly. There is no alveolar infiltrate or pleural effusion. No pulmonary parenchymal masses or nodules are observed. The heart and pulmonary vascularity are normal. The mediastinum is normal in width. The bony thorax is unremarkable. IMPRESSION: Hyperinflation likely reflects COPD or reactive airway disease. Right basilar atelectasis or scarring. No alveolar pneumonia, pneumothorax, nor CHF. Electronically Signed   By: David  Martinique M.D.   On: 08/09/2016 10:42    Procedures Procedures (including critical care time)  Medications Ordered in ED Medications - No data to display   Initial Impression / Assessment and Plan / ED  Course  I have reviewed the triage vital signs and the nursing notes.  Pertinent labs & imaging results that were available during my care of the patient were reviewed by me and considered in my medical decision making (see chart for details).     Patient has a pending MRI I have given sign out to Dr. Darl Householder.  Final Clinical Impressions(s) / ED Diagnoses   Final diagnoses:  None    New Prescriptions New Prescriptions   No medications on file     Margarita Mail, PA-C 08/09/16 [place], MD 08/09/16 1624

## 2016-08-09 NOTE — ED Provider Notes (Addendum)
  Physical Exam  BP 163/97   Pulse 80   Temp 98.9 F (37.2 C)   Resp 15   Ht 6' 2"  (1.88 m)   Wt 191 lb (86.6 kg)   SpO2 98%   BMI 24.52 kg/m   Physical Exam  ED Course  Procedures  MDM Care assumed at 5 pm from previous team. Patient had acute onset of headache and then L chest tightness and left sided numbness that resolved after taking ibuprofen. Trop neg x 2. Neurovascular intact per previous team. Sign out pending MRI brain. MRI brain unremarkable. Denies any numbness or weakness currently. I think likely complex migraines. Also BP 160s in the ED and not on BP meds so consider symptomatic hypertension. Will start lisinopril 10 mg daily. Will refer to neuro outpatient. Gave strict return precautions       Drenda Freeze, MD 08/09/16 [place] Lauriann Milillo, MD 08/09/16 Lurline Hare

## 2016-08-09 NOTE — Discharge Instructions (Signed)
Continue tylenol, motrin for headaches.   Take lisinopril 10 mg daily for elevated blood pressure.   See a neurologist  See your primary care doctor in a week to recheck your blood pressure  Return to ER if you have severe headaches, vomiting, numbness, weakness, chest pain, trouble speaking.

## 2016-08-09 NOTE — ED Triage Notes (Signed)
States was at work and  Had left sided numbness  Whole left side now it is all better  Now no weakness or trouble walking , had tightnesss nin left neck and shoulder

## 2016-08-09 NOTE — ED Provider Notes (Signed)
MSE was initiated and I personally evaluated the patient and placed orders (if any) at  10:15 AM on August 09, 2016.  The patient appears stable so that the remainder of the MSE may be completed by another provider. Patient reports that 3 AM today while at work he developed blurred vision and seeing "rainbows" company by mild headache and numbness in left arm and left leg. He stated that he did not lose use or develop any weakness and left arm or left leg. He had no trouble speaking. He reported that he held a cold can of soda and "I could not feel a coldness." Vision has returned to normal. Numbness has resolved almost completely. On exam alert appropriate Glasgow Coma Score 15 moves all extremities cranial nerves II through XII grossly intact. Code stroke was not called as symptoms resolving spontaneously and no gross neurologic deficit   Orlie Dakin, MD 08/09/16 1016

## 2016-08-16 ENCOUNTER — Encounter: Payer: Self-pay | Admitting: Family

## 2016-08-16 ENCOUNTER — Ambulatory Visit (INDEPENDENT_AMBULATORY_CARE_PROVIDER_SITE_OTHER): Payer: BLUE CROSS/BLUE SHIELD | Admitting: Family

## 2016-08-16 VITALS — BP 144/88 | HR 66 | Temp 98.2°F | Resp 16 | Ht 74.0 in | Wt 194.0 lb

## 2016-08-16 DIAGNOSIS — I10 Essential (primary) hypertension: Secondary | ICD-10-CM | POA: Diagnosis not present

## 2016-08-16 MED ORDER — LISINOPRIL 20 MG PO TABS: 20.0000 mg | ORAL_TABLET | Freq: Every day | ORAL | 0 refills | Status: DC

## 2016-08-16 NOTE — Patient Instructions (Addendum)
Thank you for choosing [facility].  SUMMARY AND INSTRUCTIONS:  Monitor your diet and decrease saturated fats and processed sugary foods.   Make small dietary changes.     Monitor your blood pressure at home daily different times throughout the day.   Medication:  Please increase your lisinopril to 20 mg daily.   Your prescription(s) have been submitted to your pharmacy or been printed and provided for you. Please take as directed and contact our office if you believe you are having problem(s) with the medication(s) or have any questions.  Follow up:  If your symptoms worsen or fail to improve, please contact our office for further instruction, or in case of emergency go directly to the emergency room at the closest medical facility.

## 2016-08-16 NOTE — Assessment & Plan Note (Signed)
Hypertension remains slightly elevated above goal 140/90 with current medication regimen. Denies worst headache of life with no new symptoms of end organ damage noted on physical exam. Neurological exam within normal limits. There is mild concern for possible TIA versus elevated blood pressure or complex migraine. Increase lisinopril to 20 mg daily. Encouraged follow low-sodium diet. Monitor blood pressure at home. Follow-up in 2 weeks for blood pressure check. Also recommend aggressive cardiac risk reduction with blood work to be completed at annual physical exam. Continue to monitor.

## 2016-08-16 NOTE — Progress Notes (Signed)
Subjective:    Patient ID: Donald Glass, male    DOB: 05-04-1976, 41 y.o.   MRN: [id number]  Chief Complaint  Patient presents with  . Hospitalization Follow-up    has not been having any headaches since being out of the hospital, has not checked BP    HPI:  Donald Glass is a 41 y.o. male who  has no past medical history on file. and presents today for a follow up office visit.  This is a new problem. Recently evaluated in the emergency department with acute onset headache, left chest tightness and left-sided numbness that improved after taking 2 ibuprofen. His troponin was noted to be negative. He is neurovascularly intact. MRI of the brain was unremarkable. There is concern for complex migraines as well as elevated blood pressure. He was started on 10 mg lisinopril and referred to neurology for further assessment with instructions to follow-up with primary care. All ED records, imaging, and labs were reviewed in detail.  Since leaving the emergency department he has noted no further symptoms of headache, chest tightness, or numbness of the left side. Reports taking lisinopril as prescribed and denies adverse side effects, hypotensive readings, or cough. Indicates he is working on obtaining a blood pressure cuff and does not check his blood pressure at home regularly. Working towards starting a low sodium diet.   BP Readings from Last 3 Encounters:  08/16/16 (!) 144/88  08/09/16 143/94  08/18/15 136/84     No Known Allergies    Outpatient Medications Prior to Visit  Medication Sig Dispense Refill  . ibuprofen (ADVIL,MOTRIN) 200 MG tablet Take 600 mg by mouth every 6 (six) hours as needed for headache or moderate pain.    Marland Kitchen lisinopril (PRINIVIL,ZESTRIL) 10 MG tablet Take 1 tablet (10 mg total) by mouth daily. 30 tablet 0   No facility-administered medications prior to visit.       No past surgical history on file.    No past medical history on file.    Review  of Systems  Constitutional: Negative for chills and fever.  Eyes:       Negative for changes in vision  Respiratory: Negative for cough, chest tightness and wheezing.   Cardiovascular: Negative for chest pain, palpitations and leg swelling.  Neurological: Negative for dizziness, weakness and light-headedness.      Objective:    BP (!) 144/88 (BP Location: Left Arm, Patient Position: Sitting, Cuff Size: Large)   Pulse 66   Temp 98.2 F (36.8 C) (Oral)   Resp 16   Ht 6' 2"  (1.88 m)   Wt 194 lb (88 kg)   SpO2 95%   BMI 24.91 kg/m  Nursing note and vital signs reviewed.  BP Readings from Last 3 Encounters:  08/16/16 (!) 144/88  08/09/16 143/94  08/18/15 136/84    Physical Exam  Constitutional: He is oriented to person, place, and time. He appears well-developed and well-nourished. No distress.  Eyes: Conjunctivae and EOM are normal. Pupils are equal, round, and reactive to light.  Cardiovascular: Normal rate, regular rhythm, normal heart sounds and intact distal pulses.  Exam reveals no gallop and no friction rub.   No murmur heard. Pulmonary/Chest: Effort normal and breath sounds normal. No respiratory distress. He has no wheezes. He has no rales. He exhibits no tenderness.  Neurological: He is alert and oriented to person, place, and time. He has normal reflexes. No cranial nerve deficit.  Skin: Skin is warm and dry.  Psychiatric:  He has a normal mood and affect. His behavior is normal. Judgment and thought content normal.       Assessment & Plan:   Problem List Items Addressed This Visit      Cardiovascular and Mediastinum   Essential hypertension    Hypertension remains slightly elevated above goal 140/90 with current medication regimen. Denies worst headache of life with no new symptoms of end organ damage noted on physical exam. Neurological exam within normal limits. There is mild concern for possible TIA versus elevated blood pressure or complex migraine. Increase  lisinopril to 20 mg daily. Encouraged follow low-sodium diet. Monitor blood pressure at home. Follow-up in 2 weeks for blood pressure check. Also recommend aggressive cardiac risk reduction with blood work to be completed at annual physical exam. Continue to monitor.      Relevant Medications   lisinopril (PRINIVIL,ZESTRIL) 20 MG tablet       I have changed Mr. Theisen lisinopril. I am also having him maintain his ibuprofen.   Meds ordered this encounter  Medications  . lisinopril (PRINIVIL,ZESTRIL) 20 MG tablet    Sig: Take 1 tablet (20 mg total) by mouth daily.    Dispense:  30 tablet    Refill:  0    Order Specific Question:   Supervising Provider    Answer:   Pricilla Holm A [[id number]]     Follow-up: Return in about 1 month (around 09/14/2016), or if symptoms worsen or fail to improve.  Donald Po, FNP

## 2016-09-05 ENCOUNTER — Ambulatory Visit (INDEPENDENT_AMBULATORY_CARE_PROVIDER_SITE_OTHER): Payer: BLUE CROSS/BLUE SHIELD | Admitting: Neurology

## 2016-09-05 ENCOUNTER — Encounter: Payer: Self-pay | Admitting: Neurology

## 2016-09-05 DIAGNOSIS — G43409 Hemiplegic migraine, not intractable, without status migrainosus: Secondary | ICD-10-CM | POA: Diagnosis not present

## 2016-09-05 DIAGNOSIS — G43909 Migraine, unspecified, not intractable, without status migrainosus: Secondary | ICD-10-CM

## 2016-09-05 HISTORY — DX: Migraine, unspecified, not intractable, without status migrainosus: G43.909

## 2016-09-05 NOTE — Progress Notes (Signed)
Reason for visit: Neurologic migraine  Referring physician: Gillett  Donald Glass is a 41 y.o. male  History of present illness:  Donald Glass is a 41 year old black male with a history of migraine headache that began in his teenage years. The patient was having more frequent headaches back then, but currently he gets a migraine only about once a year. The patient had an unusual headache for him on the 23rd of January 2018. The patient works third shift, and around 2 or 3 AM he began having onset of blurred vision and bifrontal headache. The patient then developed left-sided numbness and tingling affecting the face, arm, and leg that lasted 10-20 minutes and then resolved. The patient denied any visual loss but he did have some blurring of vision. He also had some "shooting stars" in the vision. He denied any true weakness with the sensory symptoms. The patient did not have any speech changes or difficulty with balance or falls. He has not had any troubles controlling the bowels or bladder. He denies any family history of migraine. He took ibuprofen for the headache, and this eventually resolved the headache syndrome. The patient went to the emergency room, a limited MRI of the brain was done and the diffusion weighted image was negative. The patient is sent to this office or an evaluation. The patient does have a history of hypertension.  Past Medical History:  Diagnosis Date  . Hypertension     Past Surgical History:  Procedure Laterality Date  . NO PAST SURGERIES      Family History  Problem Relation Age of Onset  . Hypertension Mother   . Arthritis Maternal Grandmother   . Cancer Paternal Grandmother     breast  . Prostate cancer Father   . Heart disease Paternal Uncle   . Diabetes Paternal Uncle   . Stroke Paternal Uncle     Social history:  reports that he has quit smoking. His smoking use included Cigarettes. He has a 20.00 pack-year smoking history. He has never used  smokeless tobacco. He reports that he drinks about 10.8 oz of alcohol per week . He reports that he uses drugs, including Marijuana, about 1 time per week.  Medications:  Prior to Admission medications   Medication Sig Start Date End Date Taking? Authorizing Provider  ibuprofen (ADVIL,MOTRIN) 200 MG tablet Take 600 mg by mouth every 6 (six) hours as needed for headache or moderate pain.   Yes Historical Provider, MD  lisinopril (PRINIVIL,ZESTRIL) 20 MG tablet Take 1 tablet (20 mg total) by mouth daily. 08/16/16  Yes Golden Circle, FNP     No Known Allergies  ROS:  Out of a complete 14 system review of symptoms, the patient complains only of the following symptoms, and all other reviewed systems are negative.  Transient left-sided numbness  Blood pressure (!) 149/102, pulse 92, height 6' 2.5" (1.892 m), weight 186 lb (84.4 kg).  Physical Exam  General: The patient is alert and cooperative at the time of the examination.  Eyes: Pupils are equal, round, and reactive to light. Discs are flat bilaterally.  Neck: The neck is supple, no carotid bruits are noted.  Respiratory: The respiratory examination is clear.  Cardiovascular: The cardiovascular examination reveals a regular rate and rhythm, no obvious murmurs or rubs are noted.  Skin: Extremities are without significant edema.  Neurologic Exam  Mental status: The patient is alert and oriented x 3 at the time of the examination. The patient  has apparent normal recent and remote memory, with an apparently normal attention span and concentration ability.  Cranial nerves: Facial symmetry is present. There is good sensation of the face to pinprick and soft touch bilaterally. The strength of the facial muscles and the muscles to head turning and shoulder shrug are normal bilaterally. Speech is well enunciated, no aphasia or dysarthria is noted. Extraocular movements are full. Visual fields are full. The tongue is midline, and the  patient has symmetric elevation of the soft palate. No obvious hearing deficits are noted.  Motor: The motor testing reveals 5 over 5 strength of all 4 extremities. Good symmetric motor tone is noted throughout.  Sensory: Sensory testing is intact to pinprick, soft touch, vibration sensation, and position sense on all 4 extremities. No evidence of extinction is noted.  Coordination: Cerebellar testing reveals good finger-nose-finger and heel-to-shin bilaterally.  Gait and station: Gait is normal. Tandem gait is normal. Romberg is negative. No drift is seen.  Reflexes: Deep tendon reflexes are symmetric and normal bilaterally. Toes are downgoing bilaterally.   Limited MRI brain 08/09/16:  IMPRESSION: No acute intracranial process on this limited diffusion weighted sequence only MRI head.  * MRI scan images were reviewed online. I agree with the written report.    Assessment/Plan:  1. Neurologic migraine  The patient had left-sided sensory symptoms with a typical migraine headache. The patient will go on low-dose aspirin, 81 mg daily. He will be set up for a carotid doppler study. The patient will follow-up through this office if needed, the headaches themselves are very infrequent. If he has ongoing problems in the future, he is to contact our office. If he has symptoms of focal weakness, numbness, or speech changes lasting greater than 30 or 40 minutes, he is to go to the emergency room immediately.  Jill Alexanders MD 09/05/2016 8:34 AM  [facility] [address] [place], [place] [address]  Phone [phone] Fax [phone]

## 2016-09-08 ENCOUNTER — Other Ambulatory Visit: Payer: BLUE CROSS/BLUE SHIELD

## 2016-09-09 ENCOUNTER — Telehealth: Payer: Self-pay | Admitting: Neurology

## 2016-09-09 NOTE — Telephone Encounter (Signed)
I called to cancel doppler for this patient. He did not answer so I left a VM asking him to call back.

## 2016-09-12 NOTE — Telephone Encounter (Signed)
Does he need to rescheduled ? Because his wife called and scheduled last apt. Please advise

## 2016-09-15 NOTE — Telephone Encounter (Signed)
Yes I left a VM he should call back, or we will need to call and reschedule him.

## 2016-09-15 NOTE — Telephone Encounter (Signed)
Noted spoke to his wife he is rescheduled. Thanks Danielle.

## 2016-09-22 ENCOUNTER — Other Ambulatory Visit: Payer: BLUE CROSS/BLUE SHIELD

## 2016-09-29 ENCOUNTER — Other Ambulatory Visit: Payer: BLUE CROSS/BLUE SHIELD

## 2016-10-04 ENCOUNTER — Encounter: Payer: Self-pay | Admitting: Family

## 2016-10-04 ENCOUNTER — Ambulatory Visit (INDEPENDENT_AMBULATORY_CARE_PROVIDER_SITE_OTHER): Payer: BLUE CROSS/BLUE SHIELD | Admitting: Family

## 2016-10-04 VITALS — BP 168/98 | HR 82 | Temp 98.2°F | Resp 16 | Ht 74.0 in | Wt 188.8 lb

## 2016-10-04 DIAGNOSIS — I10 Essential (primary) hypertension: Secondary | ICD-10-CM

## 2016-10-04 MED ORDER — LISINOPRIL 40 MG PO TABS: 40.0000 mg | ORAL_TABLET | Freq: Every day | ORAL | 1 refills | Status: DC

## 2016-10-04 NOTE — Patient Instructions (Addendum)
Thank you for choosing [facility].  SUMMARY AND INSTRUCTIONS:  Please continue to monitor your blood pressure at home.  Please increase your lisinopril to 40 mg.   Medication:  Your prescription(s) have been submitted to your pharmacy or been printed and provided for you. Please take as directed and contact our office if you believe you are having problem(s) with the medication(s) or have any questions.  Follow up:  If your symptoms worsen or fail to improve, please contact our office for further instruction, or in case of emergency go directly to the emergency room at the closest medical facility.

## 2016-10-04 NOTE — Assessment & Plan Note (Signed)
Blood pressure remains elevated above goal 140/90 with current medication regimen with average blood pressure at home being lower but hovering around 140. Increase lisinopril to 40 mg. Patient advised to bring blood pressure cuff to next office visit for correlation for accuracy. Encouraged to monitor blood pressure at home and follow sodium diet. Denies worst headache of life with no new symptoms of end organ damage noted on physical exam. Continue to monitor.

## 2016-10-04 NOTE — Progress Notes (Signed)
Subjective:    Patient ID: Donald Glass, male    DOB: February 23, 1976, 40 y.o.   MRN: [id number]  Chief Complaint  Patient presents with  . Follow-up    hypertension    HPI:  Donald Glass is a 41 y.o. male who  has a past medical history of Hypertension and Migraine headache (09/05/2016). and presents today for an office follow up.  Hypertension - Currently maintained on lisinopril. Reports taking the medications as prescribed and denies adverse side effects. Blood pressures at home have been 140/90. Denies worst headache of life with no symptoms of end organ damage. Working on following a low sodium diet.    BP Readings from Last 3 Encounters:  10/04/16 (!) 168/98  09/05/16 (!) 149/102  08/16/16 (!) 144/88      No Known Allergies    Outpatient Medications Prior to Visit  Medication Sig Dispense Refill  . aspirin 81 MG tablet Take 81 mg by mouth daily.    Marland Kitchen ibuprofen (ADVIL,MOTRIN) 200 MG tablet Take 600 mg by mouth every 6 (six) hours as needed for headache or moderate pain.    Marland Kitchen lisinopril (PRINIVIL,ZESTRIL) 20 MG tablet Take 1 tablet (20 mg total) by mouth daily. 30 tablet 0   No facility-administered medications prior to visit.      Review of Systems  Constitutional: Negative for chills and fever.  Eyes:       Negative for changes in vision  Respiratory: Negative for cough, chest tightness and wheezing.   Cardiovascular: Negative for chest pain, palpitations and leg swelling.  Neurological: Negative for dizziness, weakness and light-headedness.      Objective:    BP (!) 168/98 (BP Location: Left Arm, Patient Position: Sitting, Cuff Size: Large)   Pulse 82   Temp 98.2 F (36.8 C) (Oral)   Resp 16   Ht 6' 2"  (1.88 m)   Wt 188 lb 12.8 oz (85.6 kg)   SpO2 96%   BMI 24.24 kg/m  Nursing note and vital signs reviewed.  Physical Exam  Constitutional: He is oriented to person, place, and time. He appears well-developed and well-nourished. No distress.    Cardiovascular: Normal rate, regular rhythm, normal heart sounds and intact distal pulses.   Pulmonary/Chest: Effort normal and breath sounds normal.  Neurological: He is alert and oriented to person, place, and time.  Skin: Skin is warm and dry.  Psychiatric: He has a normal mood and affect. His behavior is normal. Judgment and thought content normal.       Assessment & Plan:   Problem List Items Addressed This Visit      Cardiovascular and Mediastinum   Essential hypertension - Primary    Blood pressure remains elevated above goal 140/90 with current medication regimen with average blood pressure at home being lower but hovering around 140. Increase lisinopril to 40 mg. Patient advised to bring blood pressure cuff to next office visit for correlation for accuracy. Encouraged to monitor blood pressure at home and follow sodium diet. Denies worst headache of life with no new symptoms of end organ damage noted on physical exam. Continue to monitor.      Relevant Medications   lisinopril (PRINIVIL,ZESTRIL) 40 MG tablet       I have changed Donald Glass lisinopril. I am also having him maintain his ibuprofen and aspirin.   Meds ordered this encounter  Medications  . lisinopril (PRINIVIL,ZESTRIL) 40 MG tablet    Sig: Take 1 tablet (40 mg total) by mouth  daily.    Dispense:  30 tablet    Refill:  1    Order Specific Question:   Supervising Provider    Answer:   Pricilla Holm A [[id number]]     Follow-up: Return in about 2 weeks (around 10/18/2016), or if symptoms worsen or fail to improve.  Mauricio Po, FNP

## 2016-10-13 ENCOUNTER — Ambulatory Visit (INDEPENDENT_AMBULATORY_CARE_PROVIDER_SITE_OTHER): Payer: BLUE CROSS/BLUE SHIELD

## 2016-10-13 DIAGNOSIS — G43409 Hemiplegic migraine, not intractable, without status migrainosus: Secondary | ICD-10-CM | POA: Diagnosis not present

## 2016-10-18 ENCOUNTER — Telehealth: Payer: Self-pay | Admitting: Neurology

## 2016-10-18 NOTE — Telephone Encounter (Signed)
I called patient. The carotid Doppler study was unremarkable. He will call our office if he is having any new issues.

## 2016-12-14 ENCOUNTER — Other Ambulatory Visit: Payer: Self-pay | Admitting: Family

## 2017-02-15 ENCOUNTER — Other Ambulatory Visit: Payer: Self-pay | Admitting: Family

## 2017-06-22 ENCOUNTER — Telehealth: Payer: Self-pay

## 2017-06-22 ENCOUNTER — Other Ambulatory Visit: Payer: Self-pay | Admitting: Nurse Practitioner

## 2017-06-22 ENCOUNTER — Ambulatory Visit: Payer: BLUE CROSS/BLUE SHIELD

## 2017-06-22 VITALS — BP 168/90

## 2017-06-22 DIAGNOSIS — I10 Essential (primary) hypertension: Secondary | ICD-10-CM

## 2017-06-22 MED ORDER — AMLODIPINE BESYLATE 5 MG PO TABS: 5.0000 mg | ORAL_TABLET | Freq: Every day | ORAL | 0 refills | Status: DC

## 2017-06-22 NOTE — Progress Notes (Signed)
Nurse visit- BP check-medication adjustment made.

## 2017-06-22 NOTE — Telephone Encounter (Signed)
I have advised Donald Glass that patient would like to establish with her (transfer from [facility])---he recently had bp screening for new job and was told his bp was too high and needed to be at [place] 159/99---patient came in on nurse visit today with reading 168/90---ashleigh has sent rx to add amlodipine---patient is to take for 2 weeks and then come back to see [name] for bp recheck---patient advised---patient states he is asymptomatic and has been advised to call us if symptoms appear

## 2017-07-06 ENCOUNTER — Encounter: Payer: Self-pay | Admitting: Nurse Practitioner

## 2017-07-06 ENCOUNTER — Telehealth: Payer: Self-pay

## 2017-07-06 ENCOUNTER — Ambulatory Visit: Payer: BLUE CROSS/BLUE SHIELD | Admitting: Nurse Practitioner

## 2017-07-06 DIAGNOSIS — I10 Essential (primary) hypertension: Secondary | ICD-10-CM

## 2017-07-06 MED ORDER — AMLODIPINE BESYLATE 10 MG PO TABS: 5.0000 mg | ORAL_TABLET | Freq: Every day | ORAL | 1 refills | Status: DC

## 2017-07-06 NOTE — Patient Instructions (Addendum)
Please continue your lisinopril 28m once daily. Please increase your amlodipine to 124monce daily. , Please try to check your blood pressure once daily or at least a few times a week, at the same time each day, and keep a log.  Your blood pressure goal is less than 140/90.  Please remember to include healthy diet, low salt, and exercise into your day to get better control over your blood pressure.  Id like to see you back in about 1 month, or sooner if you need me.    Mediterranean Diet A Mediterranean diet refers to food and lifestyle choices that are based on the traditions of countries located on the [place] [employer] way of eating has been shown to help prevent certain conditions and improve outcomes for people who have chronic diseases, like kidney disease and heart disease. What are tips for following this plan? Lifestyle  Cook and eat meals together with your family, when possible.  Drink enough fluid to keep your urine clear or pale yellow.  Be physically active every day. This includes: ? Aerobic exercise like running or swimming. ? Leisure activities like gardening, walking, or housework.  Get 7-8 hours of sleep each night.  If recommended by your health care provider, drink red wine in moderation. This means 1 glass a day for nonpregnant women and 2 glasses a day for men. A glass of wine equals 5 oz (150 mL). Reading food labels  Check the serving size of packaged foods. For foods such as rice and pasta, the serving size refers to the amount of cooked product, not dry.  Check the total fat in packaged foods. Avoid foods that have saturated fat or trans fats.  Check the ingredients list for added sugars, such as corn syrup. Shopping  At the grocery store, buy most of your food from the areas near the walls of the store. This includes: ? Fresh fruits and vegetables (produce). ? Grains, beans, nuts, and seeds. Some of these may be available in unpackaged forms  or large amounts (in bulk). ? Fresh seafood. ? Poultry and eggs. ? Low-fat dairy products.  Buy whole ingredients instead of prepackaged foods.  Buy fresh fruits and vegetables in-season from local farmers markets.  Buy frozen fruits and vegetables in resealable bags.  If you do not have access to quality fresh seafood, buy precooked frozen shrimp or canned fish, such as tuna, salmon, or sardines.  Buy small amounts of raw or cooked vegetables, salads, or olives from the deli or salad bar at your store.  Stock your pantry so you always have certain foods on hand, such as olive oil, canned tuna, canned tomatoes, rice, pasta, and beans. Cooking  Cook foods with extra-virgin olive oil instead of using butter or other vegetable oils.  Have meat as a side dish, and have vegetables or grains as your main dish. This means having meat in small portions or adding small amounts of meat to foods like pasta or stew.  Use beans or vegetables instead of meat in common dishes like chili or lasagna.  Experiment with different cooking methods. Try roasting or broiling vegetables instead of steaming or sauteing them.  Add frozen vegetables to soups, stews, pasta, or rice.  Add nuts or seeds for added healthy fat at each meal. You can add these to yogurt, salads, or vegetable dishes.  Marinate fish or vegetables using olive oil, lemon juice, garlic, and fresh herbs. Meal planning  Plan to eat 1 vegetarian meal one  day each week. Try to work up to 2 vegetarian meals, if possible.  Eat seafood 2 or more times a week.  Have healthy snacks readily available, such as: ? Vegetable sticks with hummus. ? [place] yogurt. ? Fruit and nut trail mix.  Eat balanced meals throughout the week. This includes: ? Fruit: 2-3 servings a day ? Vegetables: 4-5 servings a day ? Low-fat dairy: 2 servings a day ? Fish, poultry, or lean meat: 1 serving a day ? Beans and legumes: 2 or more servings a week ? Nuts  and seeds: 1-2 servings a day ? Whole grains: 6-8 servings a day ? Extra-virgin olive oil: 3-4 servings a day  Limit red meat and sweets to only a few servings a month What are my food choices?  Mediterranean diet ? Recommended ? Grains: Whole-grain pasta. Brown rice. Bulgar wheat. Polenta. Couscous. Whole-wheat bread. [place]. ? Vegetables: Artichokes. Beets. Broccoli. Cabbage. Carrots. Eggplant. Green beans. Chard. Kale. Spinach. Onions. Leeks. Peas. Squash. Tomatoes. Peppers. Radishes. ? Fruits: Apples. Apricots. Avocado. Berries. Bananas. Cherries. Dates. Figs. Grapes. Lemons. Melon. Oranges. Peaches. Plums. Pomegranate. ? Meats and other protein foods: Beans. Almonds. Sunflower seeds. Pine nuts. Peanuts. [place]. Salmon. Scallops. Shrimp. [place]. Tilapia. Clams. Oysters. Eggs. ? Dairy: Low-fat milk. Cheese. Greek yogurt. ? Beverages: Water. Red wine. Herbal tea. ? Fats and oils: Extra virgin olive oil. Avocado oil. Grape seed oil. ? Sweets and desserts: [place] yogurt with honey. Baked apples. Poached pears. Trail mix. ? Seasoning and other foods: Basil. Cilantro. Coriander. Cumin. Mint. Parsley. Sage. Rosemary. Tarragon. Garlic. Oregano. Thyme. Pepper. Balsalmic vinegar. Tahini. Hummus. Tomato sauce. Olives. Mushrooms. ? Limit these ? Grains: Prepackaged pasta or rice dishes. Prepackaged cereal with added sugar. ? Vegetables: Deep fried potatoes (french fries). ? Fruits: Fruit canned in syrup. ? Meats and other protein foods: Beef. Pork. Lamb. Poultry with skin. Hot dogs. [place] Salines. ? Dairy: Ice cream. Sour cream. Whole milk. ? Beverages: Juice. Sugar-sweetened soft drinks. Beer. Liquor and spirits. ? Fats and oils: Butter. Canola oil. Vegetable oil. Beef fat (tallow). Lard. ? Sweets and desserts: Cookies. Cakes. Pies. Candy. ? Seasoning and other foods: Mayonnaise. Premade sauces and marinades. ? The items listed may not be a complete list. Talk with your dietitian about what dietary  choices are right for you. Summary  The Mediterranean diet includes both food and lifestyle choices.  Eat a variety of fresh fruits and vegetables, beans, nuts, seeds, and whole grains.  Limit the amount of red meat and sweets that you eat.  Talk with your health care provider about whether it is safe for you to drink red wine in moderation. This means 1 glass a day for nonpregnant women and 2 glasses a day for men. A glass of wine equals 5 oz (150 mL). This information is not intended to replace advice given to you by your health care provider. Make sure you discuss any questions you have with your health care provider. Document Released: 02/25/2016 Document Revised: 03/29/2016 Document Reviewed: 02/25/2016 Elsevier Interactive Patient Education  [employer].

## 2017-07-06 NOTE — Telephone Encounter (Signed)
Blood pressure clearance form has been faxed to Ovidio [employer]anger with [employer]. Fax # [phone].

## 2017-07-06 NOTE — Assessment & Plan Note (Signed)
BP remains elevated above goal 140/90. Will continue lisinopril 40 and increase amlodipine to 10 daily. Medications ordered: - amLODipine (NORVASC) 10 MG tablet; Take 0.5 tablets (5 mg total) by mouth daily.  Dispense: 30 tablet; Refill: 1 His blood pressure reading is near the cut off for clearance for his work form. He is eager to begin work, and does fall below the cut off  I will sign the form today but we will continue to work towards decreasing his blood pressure. Healthy diet and exercise discussed. Pt education handout provided. 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

## 2017-07-06 NOTE — Progress Notes (Signed)
Subjective:    Patient ID: Donald Glass, male    DOB: 07-17-1976, 41 y.o.   MRN: [id number]  HPI Donald Glass is a 41 yo male who presents today to establish care. He is transferring to me from another provider in the same clinic. He Has the following significant medical problems: hypertension, migraine headache. He is here today for elevated blood pressure reading.   He has been maintained on lisinopril 40 daily for some time. At a recent job screening his blood pressure was elevated and he came into our office for a blood pressure check with the nurse with repeated elevated blood pressure reading. He was started on amlodipine 79m with instructions to follow up for a recheck today.  He reports he does not check his blood pressure at home. He does take his medications daily. He denies headaches, vision changes, chest pain, shortness of breath, edema. He feels he tolerates his blood pressure medications well with no adverse effects. He is hoping to get blood pressure clearance for his job today, he needs to have reading below 159/99 to work.  BP Readings from Last 3 Encounters:  07/06/17 (!) 156/88  06/22/17 (!) 168/90  10/04/16 (!) 168/98    Review of Systems  See HPI  Past Medical History:  Diagnosis Date  . Hypertension   . Migraine headache 09/05/2016     Social History   Socioeconomic History  . Marital status: Married    Spouse name: LDebroah Loop . Number of children: 4  . Years of education: 194 . Highest education level: Not on file  Social Needs  . Financial resource strain: Not on file  . Food insecurity - worry: Not on file  . Food insecurity - inability: Not on file  . Transportation needs - medical: Not on file  . Transportation needs - non-medical: Not on file  Occupational History  . 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 Smoking status: Former Smoker    Packs/day: 1.00    Years: 20.00    Pack years: 20.00    Types: Cigarettes  . Smokeless tobacco:  Never Used  Substance and Sexual Activity  . Alcohol use: Yes    Alcohol/week: 10.8 oz    Types: 18 Cans of beer per week  . Drug use: Yes    Frequency: 1.0 times per week    Types: Marijuana  . Sexual activity: Yes    Birth control/protection: Condom  Other Topics Concern  . Not on file  Social History Narrative   Lives with wife and children   Caffeine use: Coffee/soda/tea daily   Fun: Watch football, movies, video games    Past Surgical History:  Procedure Laterality Date  . NO PAST SURGERIES      Family History  Problem Relation Age of Onset  . Hypertension Mother   . Arthritis Maternal Grandmother   . Cancer Paternal Grandmother        breast  . Prostate cancer Father   . Heart disease Paternal Uncle   . Diabetes Paternal Uncle   . Stroke Paternal Uncle     No Known Allergies  Current Outpatient Medications on File Prior to Visit  Medication Sig Dispense Refill  . amLODipine (NORVASC) 5 MG tablet Take 1 tablet (5 mg total) by mouth daily. 30 tablet 0  . aspirin 81 MG tablet Take 81 mg by mouth daily.    .Marland Kitchenibuprofen (ADVIL,MOTRIN) 200 MG tablet Take 600 mg by mouth every 6 (  six) hours as needed for headache or moderate pain.    Marland Kitchen lisinopril (PRINIVIL,ZESTRIL) 40 MG tablet TAKE 1 TABLET (40 MG TOTAL) BY MOUTH DAILY. 90 tablet 1   No current facility-administered medications on file prior to visit.     BP (!) 156/88   Pulse 91   Temp 98.4 F (36.9 C) (Oral)   Resp 16   Ht 6' 2"  (1.88 m)   Wt 183 lb (83 kg)   SpO2 96%   BMI 23.50 kg/m       Objective:   Physical Exam  Constitutional: He is oriented to person, place, and time. He appears well-developed and well-nourished. No distress.  HENT:  Head: Normocephalic and atraumatic.  Cardiovascular: Normal rate, regular rhythm, normal heart sounds and intact distal pulses.  Pulmonary/Chest: Effort normal and breath sounds normal.  Musculoskeletal: He exhibits no edema.  Neurological: He is alert and  oriented to person, place, and time. Coordination normal.  Skin: Skin is warm and dry.  Psychiatric: He has a normal mood and affect. Judgment and thought content normal.       Assessment & Plan:   \

## 2017-07-07 ENCOUNTER — Encounter: Payer: Self-pay | Admitting: Nurse Practitioner

## 2017-08-28 ENCOUNTER — Other Ambulatory Visit: Payer: Self-pay | Admitting: Family

## 2017-09-04 ENCOUNTER — Other Ambulatory Visit: Payer: Self-pay

## 2017-09-04 DIAGNOSIS — I10 Essential (primary) hypertension: Secondary | ICD-10-CM

## 2017-09-04 MED ORDER — AMLODIPINE BESYLATE 10 MG PO TABS: 5.0000 mg | ORAL_TABLET | Freq: Every day | ORAL | 1 refills | Status: DC

## 2017-10-04 ENCOUNTER — Encounter: Payer: Self-pay | Admitting: Family

## 2017-10-04 ENCOUNTER — Ambulatory Visit: Payer: 59 | Admitting: Family

## 2017-10-04 VITALS — BP 160/90 | HR 99 | Temp 98.5°F | Ht 74.0 in | Wt 188.1 lb

## 2017-10-04 DIAGNOSIS — I10 Essential (primary) hypertension: Secondary | ICD-10-CM | POA: Diagnosis not present

## 2017-10-04 DIAGNOSIS — R42 Dizziness and giddiness: Secondary | ICD-10-CM

## 2017-10-04 MED ORDER — LISINOPRIL 40 MG PO TABS: 40.0000 mg | ORAL_TABLET | Freq: Every day | ORAL | 1 refills | Status: DC

## 2017-10-04 NOTE — Progress Notes (Signed)
  Donald Glass is a 42 y.o. male with the following history as recorded in [employer]:  Patient Active Problem List   Diagnosis Date Noted  . Migraine headache 09/05/2016  . Essential hypertension 08/16/2016  . Routine general medical examination at a health care facility 08/18/2015  . Tobacco use disorder, continuous 08/18/2015    Current Outpatient Medications  Medication Sig Dispense Refill  . amLODipine (NORVASC) 10 MG tablet Take 0.5 tablets (5 mg total) by mouth daily. 90 tablet 1  . aspirin 81 MG tablet Take 81 mg by mouth daily.    Marland Kitchen ibuprofen (ADVIL,MOTRIN) 200 MG tablet Take 600 mg by mouth every 6 (six) hours as needed for headache or moderate pain.    Marland Kitchen lisinopril (PRINIVIL,ZESTRIL) 40 MG tablet Take 1 tablet (40 mg total) by mouth daily. 90 tablet 1   No current facility-administered medications for this visit.     Allergies: Patient has no known allergies.  Past Medical History:  Diagnosis Date  . Hypertension   . Migraine headache 09/05/2016    Past Surgical History:  Procedure Laterality Date  . NO PAST SURGERIES      Family History  Problem Relation Age of Onset  . Hypertension Mother   . Arthritis Maternal Grandmother   . Cancer Paternal Grandmother        breast  . Prostate cancer Father   . Heart disease Paternal Uncle   . Diabetes Paternal Uncle   . Stroke Paternal Uncle     Social History   Tobacco Use  . Smoking status: Former Smoker    Packs/day: 1.00    Years: 20.00    Pack years: 20.00    Types: Cigarettes  . Smokeless tobacco: Never Used  Substance Use Topics  . Alcohol use: Yes    Alcohol/week: 10.8 oz    Types: 18 Cans of beer per week    Subjective:  3 week history of dizziness; admits has been off Lisinopril x 3 weeks; Denies any chest pain, shortness of breath, blurred vision or headache. Does take Amlodipine in the am- has been able to continue getting that medication; insurance changed and was not able to continue getting  Lisinopril; of note, he takes that medication in the evenings and symptoms of dizziness most noticeable at night.    Objective:  Vitals:   10/04/17 1633  BP: (!) 160/90  Pulse: 99  Temp: 98.5 F (36.9 C)  TempSrc: Oral  SpO2: 100%  Weight: 188 lb 1.9 oz (85.3 kg)  Height: 6' 2"  (1.88 m)    General: Well developed, well nourished, in no acute distress  Skin : Warm and dry.  Lungs: Respirations unlabored; clear to auscultation bilaterally without wheeze, rales, rhonchi  CVS exam: normal rate and regular rhythm.  Neurologic: Alert and oriented; speech intact; face symmetrical; moves all extremities well; CNII-XII intact without focal deficit  Assessment:  1. Dizziness   2. Essential hypertension     Plan:  Suspect symptoms due to being off of his medication; re-start Lisinopril 40 mg daily; continue Amlodipine; keep planned follow-up for CPE in May; follow-up if symptoms do not resolve with re-starting medication.   No follow-ups on file.  No orders of the defined types were placed in this encounter.   Requested Prescriptions   Signed Prescriptions Disp Refills  . lisinopril (PRINIVIL,ZESTRIL) 40 MG tablet 90 tablet 1    Sig: Take 1 tablet (40 mg total) by mouth daily.

## 2017-12-04 ENCOUNTER — Encounter: Payer: BLUE CROSS/BLUE SHIELD | Admitting: Nurse Practitioner

## 2018-01-01 IMAGING — MR MR HEAD W/O CM
4 series · 48 of 48 positions shown · non-contrast
Comparison: None.

CLINICAL DATA: Mild headache, blurry vision and brief episode LEFT
body numbness and chest pain. Now at baseline.

EXAM:
MRI HEAD WITHOUT CONTRAST
TECHNIQUE: Coronal and axial diffusion weighted imaging pulse sequences of the
brain and surrounding structures were obtained without intravenous
contrast. Patient reported feeling hot and tachycardiac, examination
was terminated.

[Series 3: DWI · axial · 3.0mm · 0.94mm/px · z∈[-64,+82]mm · 19 of 100 slices shown (1 of 2)]
[im 1/100]
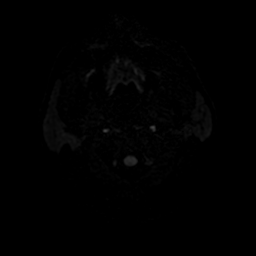
[im 6/100]
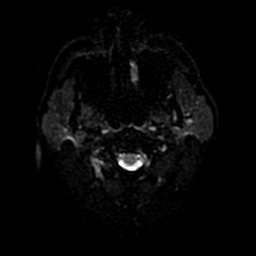
[im 12/100]
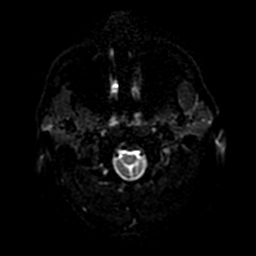
[im 17/100]
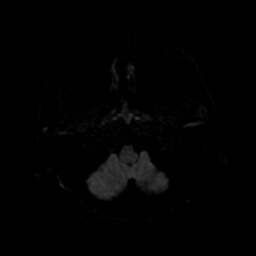
[im 23/100]
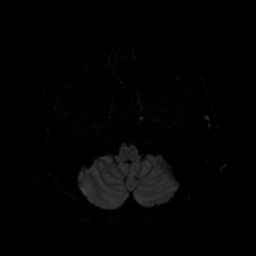
[im 28/100]
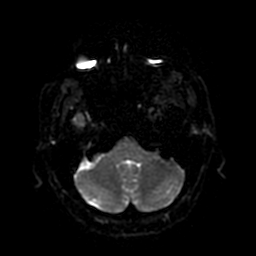
[im 34/100]
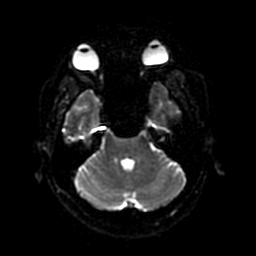
[im 39/100]
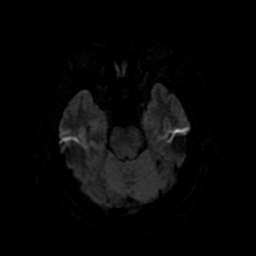
[im 45/100]
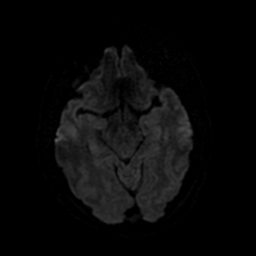
[im 50/100]
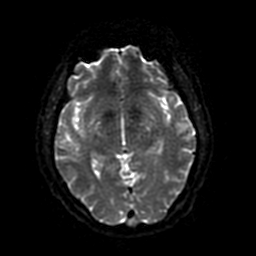
[im 56/100]
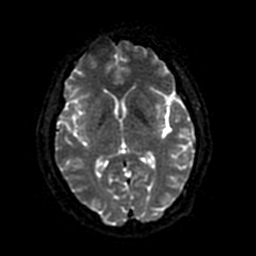
[im 61/100]
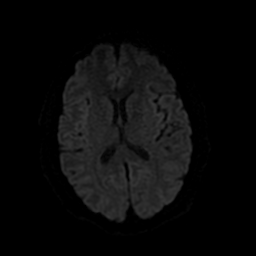
[im 67/100]
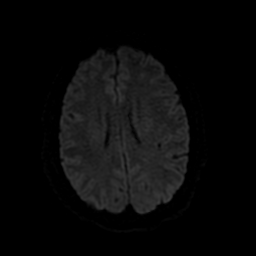
[im 72/100]
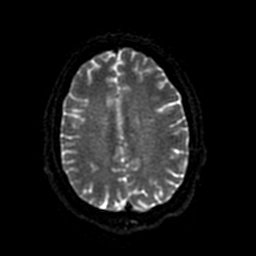
[im 78/100]
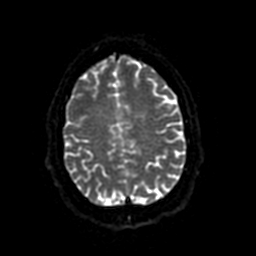
[im 83/100]
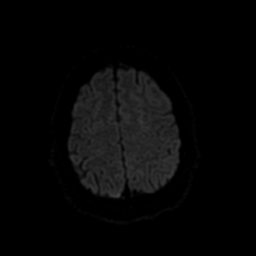
[im 89/100]
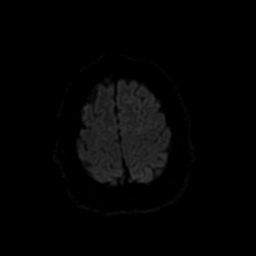
[im 94/100]
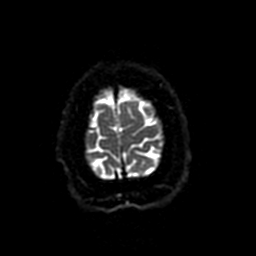
[im 100/100]
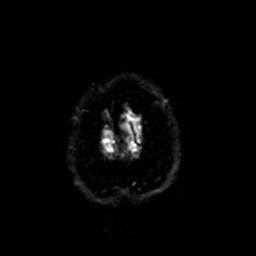

[Series 4: DWI · coronal · 4.0mm · 0.94mm/px · 13 of 72 slices shown (2 of 2)]
[im 1/72]
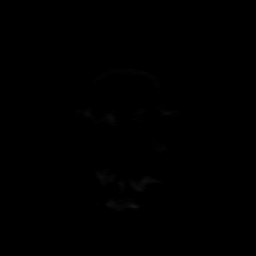
[im 6/72]
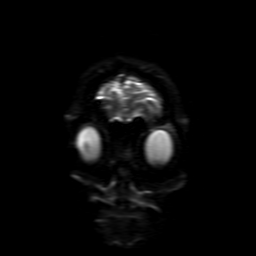
[im 12/72]
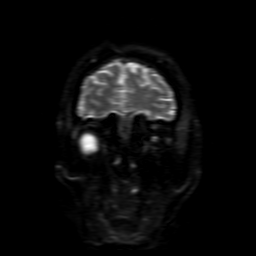
[im 18/72]
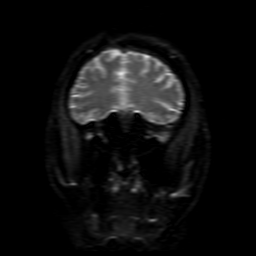
[im 24/72]
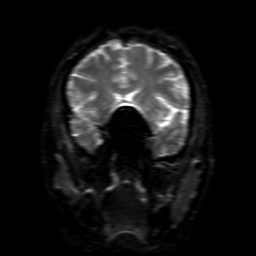
[im 30/72]
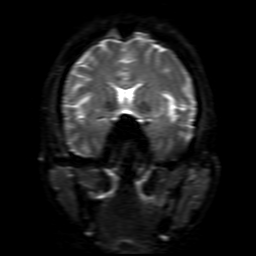
[im 36/72]
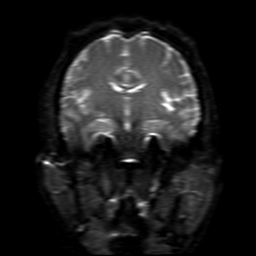
[im 42/72]
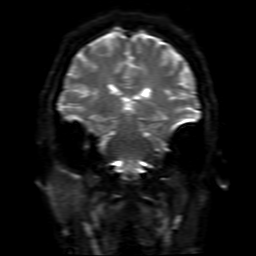
[im 48/72]
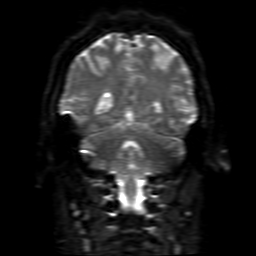
[im 54/72]
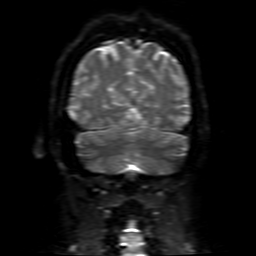
[im 60/72]
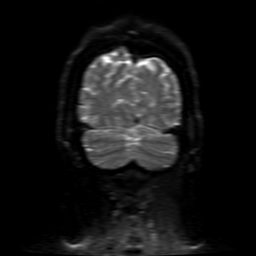
[im 66/72]
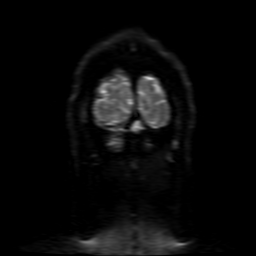
[im 72/72]
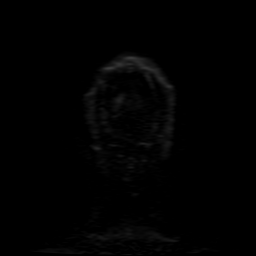

[Series 350: ADC · axial · 3.0mm · 0.94mm/px · z∈[-64,+82]mm · 9 of 50 slices shown (1 of 2)]
[im 1/50]
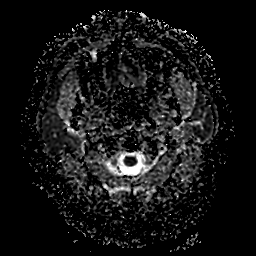
[im 7/50]
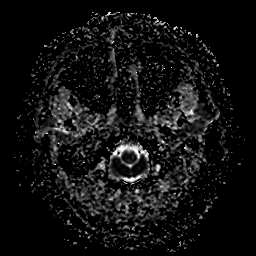
[im 13/50]
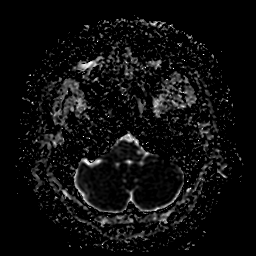
[im 19/50]
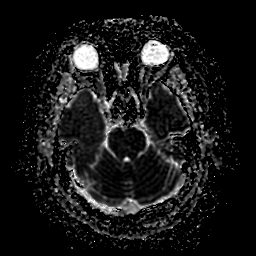
[im 25/50]
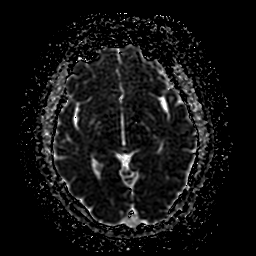
[im 31/50]
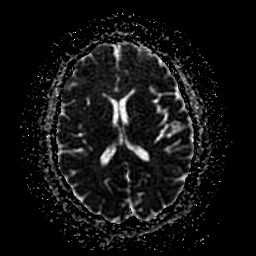
[im 37/50]
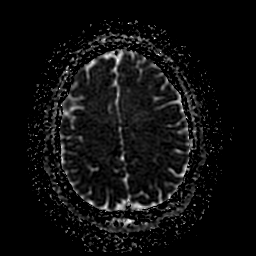
[im 43/50]
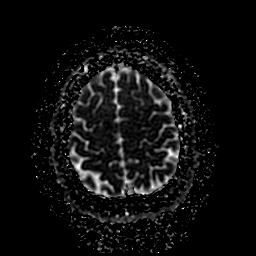
[im 50/50]
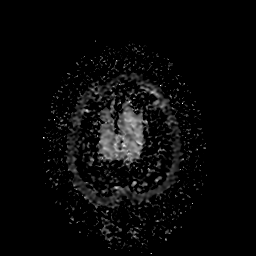

[Series 450: ADC · coronal · 4.0mm · 0.94mm/px · 7 of 36 slices shown (2 of 2)]
[im 1/36]
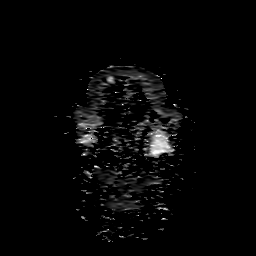
[im 6/36]
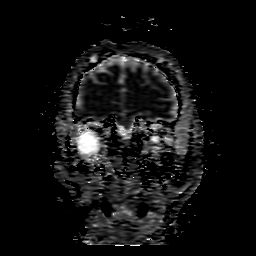
[im 12/36]
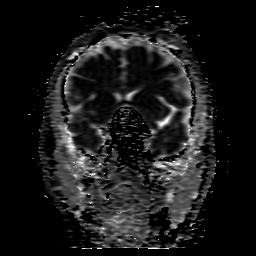
[im 18/36]
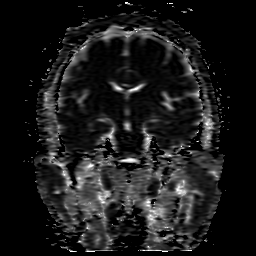
[im 24/36]
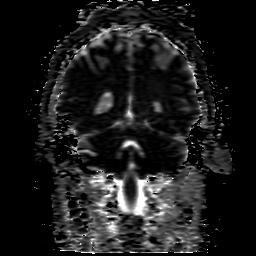
[im 30/36]
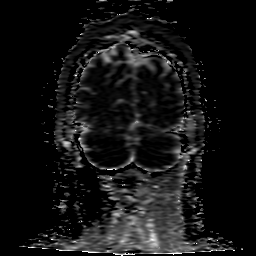
[im 36/36]
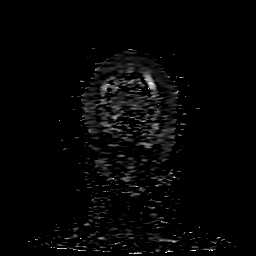

[48 of 48 positions shown; findings below may reference images not displayed]

FINDINGS: No reduced diffusion to suggest acute ischemia, hyperacute
demyelination, infection or hypercellular tumor. Ventricles and
sulci are normal for patient's age. No midline shift, mass effect or
masses. No definite extra-axial fluid collections though limited
assessment.
IMPRESSION: No acute intracranial process on this limited diffusion weighted
sequence only MRI head.

## 2018-01-05 ENCOUNTER — Encounter: Payer: Self-pay | Admitting: Nurse Practitioner

## 2018-01-05 ENCOUNTER — Other Ambulatory Visit (INDEPENDENT_AMBULATORY_CARE_PROVIDER_SITE_OTHER): Payer: 59

## 2018-01-05 ENCOUNTER — Ambulatory Visit (INDEPENDENT_AMBULATORY_CARE_PROVIDER_SITE_OTHER): Payer: 59 | Admitting: Nurse Practitioner

## 2018-01-05 VITALS — BP 140/82 | HR 75 | Temp 98.6°F | Resp 16 | Ht 74.0 in | Wt 179.0 lb

## 2018-01-05 DIAGNOSIS — Z114 Encounter for screening for human immunodeficiency virus [HIV]: Secondary | ICD-10-CM

## 2018-01-05 DIAGNOSIS — R7309 Other abnormal glucose: Secondary | ICD-10-CM

## 2018-01-05 DIAGNOSIS — Z0000 Encounter for general adult medical examination without abnormal findings: Secondary | ICD-10-CM

## 2018-01-05 DIAGNOSIS — Z13220 Encounter for screening for lipoid disorders: Secondary | ICD-10-CM

## 2018-01-05 DIAGNOSIS — I10 Essential (primary) hypertension: Secondary | ICD-10-CM

## 2018-01-05 LAB — CBC
HEMATOCRIT: 45.8 % (ref 39.0–52.0)
Hemoglobin: 15.4 g/dL (ref 13.0–17.0)
MCHC: 33.6 g/dL (ref 30.0–36.0)
MCV: 88.6 fl (ref 78.0–100.0)
PLATELETS: 243 10*3/uL (ref 150.0–400.0)
RBC: 5.17 Mil/uL (ref 4.22–5.81)
RDW: 12.6 % (ref 11.5–15.5)
WBC: 5.5 10*3/uL (ref 4.0–10.5)

## 2018-01-05 LAB — COMPREHENSIVE METABOLIC PANEL
ALBUMIN: 4.6 g/dL (ref 3.5–5.2)
ALT: 23 U/L (ref 0–53)
AST: 22 U/L (ref 0–37)
Alkaline Phosphatase: 48 U/L (ref 39–117)
BUN: 11 mg/dL (ref 6–23)
CALCIUM: 9.7 mg/dL (ref 8.4–10.5)
CHLORIDE: 99 meq/L (ref 96–112)
CO2: 30 meq/L (ref 19–32)
Creatinine, Ser: 0.98 mg/dL (ref 0.40–1.50)
GFR: 107.19 mL/min (ref >60.00)
Glucose, Bld: 107 mg/dL — ABNORMAL HIGH (ref 70–99)
Potassium: 4.3 mEq/L (ref 3.5–5.1)
Sodium: 136 mEq/L (ref 135–145)
Total Bilirubin: 0.5 mg/dL (ref 0.2–1.2)
Total Protein: 7.5 g/dL (ref 6.0–8.3)

## 2018-01-05 LAB — LIPID PANEL
CHOL/HDL RATIO: 4
Cholesterol: 227 mg/dL — ABNORMAL HIGH (ref 0–200)
HDL: 59.5 mg/dL (ref >39.00)
LDL CALC: 150 mg/dL — AB (ref 0–99)
NONHDL: 167.03
Triglycerides: 87 mg/dL (ref 0.0–149.0)
VLDL: 17.4 mg/dL (ref 0.0–40.0)

## 2018-01-05 LAB — TSH: TSH: 0.46 u[IU]/mL (ref 0.35–4.50)

## 2018-01-05 LAB — HEMOGLOBIN A1C: HEMOGLOBIN A1C: 6.2 % (ref 4.6–6.5)

## 2018-01-05 NOTE — Assessment & Plan Note (Addendum)
BP readings on higher end of normal Continue current medications Continue to monitor home BP readings Discussed the role of healthy diet and exercise in the management of HTN RTC in 6 months for F/U, sooner for readings >140/90 at home - CBC; Future - Comprehensive metabolic panel; Future - TSH; Future

## 2018-01-05 NOTE — Assessment & Plan Note (Signed)
-  USPSTF grade A and B recommendations reviewed with patient; age-appropriate recommendations, preventive care, screening tests, etc discussed and encouraged; healthy living and sunscreen use encouraged; see AVS for patient education given to patient. Declines advanced directives packet. -Discussed importance of 150 minutes of physical activity weekly,  eat 6 servings of fruit/vegetables daily and drink plenty of water and avoid sweet beverages.  -Follow up and care instructions discussed and provided in AVS.   -Reviewed Health Maintenance: Screening for HIV (human immunodeficiency virus)- HIV antibody; Future  Screening for cholesterol level- fasting today- Lipid panel; Future  Elevated glucose- Hemoglobin A1c; Future

## 2018-01-05 NOTE — Patient Instructions (Addendum)
Please head downstairs for lab work/x-rays. If any of your test results are critically abnormal, you will be contacted right away. Otherwise, I will contact you within a week about your test results and any recommendations for abnormalities.  Please return in about 6 months for routine follow up, or sooner if needed.   Health Maintenance, Male A healthy lifestyle and preventive care is important for your health and wellness. Ask your health care provider about what schedule of regular examinations is right for you. What should I know about weight and diet? Eat a Healthy Diet  Eat plenty of vegetables, fruits, whole grains, low-fat dairy products, and lean protein.  Do not eat a lot of foods high in solid fats, added sugars, or salt.  Maintain a Healthy Weight Regular exercise can help you achieve or maintain a healthy weight. You should:  Do at least 150 minutes of exercise each week. The exercise should increase your heart rate and make you sweat (moderate-intensity exercise).  Do strength-training exercises at least twice a week.  Watch Your Levels of Cholesterol and Blood Lipids  Have your blood tested for lipids and cholesterol every 5 years starting at 43 years of age. If you are at high risk for heart disease, you should start having your blood tested when you are 43 years old. You may need to have your cholesterol levels checked more often if: ? Your lipid or cholesterol levels are high. ? You are older than 43 years of age. ? You are at high risk for heart disease.  What should I know about cancer screening? Many types of cancers can be detected early and may often be prevented. Lung Cancer  You should be screened every year for lung cancer if: ? You are a current smoker who has smoked for at least 30 years. ? You are a former smoker who has quit within the past 15 years.  Talk to your health care provider about your screening options, when you should start screening,  and how often you should be screened.  Colorectal Cancer  Routine colorectal cancer screening usually begins at 43 years of age and should be repeated every 5-10 years until you are 43 years old. You may need to be screened more often if early forms of precancerous polyps or small growths are found. Your health care provider may recommend screening at an earlier age if you have risk factors for colon cancer.  Your health care provider may recommend using home test kits to check for hidden blood in the stool.  A small camera at the end of a tube can be used to examine your colon (sigmoidoscopy or colonoscopy). This checks for the earliest forms of colorectal cancer.  Prostate and Testicular Cancer  Depending on your age and overall health, your health care provider may do certain tests to screen for prostate and testicular cancer.  Talk to your health care provider about any symptoms or concerns you have about testicular or prostate cancer.  Skin Cancer  Check your skin from head to toe regularly.  Tell your health care provider about any new moles or changes in moles, especially if: ? There is a change in a mole's size, shape, or color. ? You have a mole that is larger than a pencil eraser.  Always use sunscreen. Apply sunscreen liberally and repeat throughout the day.  Protect yourself by wearing long sleeves, pants, a wide-brimmed hat, and sunglasses when outside.  What should I know about heart disease, diabetes,  and high blood pressure?  If you are 11-22 years of age, have your blood pressure checked every 3-5 years. If you are 67 years of age or older, have your blood pressure checked every year. You should have your blood pressure measured twice-once when you are at a hospital or clinic, and once when you are not at a hospital or clinic. Record the average of the two measurements. To check your blood pressure when you are not at a hospital or clinic, you can use: ? An automated  blood pressure machine at a pharmacy. ? A home blood pressure monitor.  Talk to your health care provider about your target blood pressure.  If you are between 63-12 years old, ask your health care provider if you should take aspirin to prevent heart disease.  Have regular diabetes screenings by checking your fasting blood sugar level. ? If you are at a normal weight and have a low risk for diabetes, have this test once every three years after the age of 5. ? If you are overweight and have a high risk for diabetes, consider being tested at a younger age or more often.  A one-time screening for abdominal aortic aneurysm (AAA) by ultrasound is recommended for men aged 38-75 years who are current or former smokers. What should I know about preventing infection? Hepatitis B If you have a higher risk for hepatitis B, you should be screened for this virus. Talk with your health care provider to find out if you are at risk for hepatitis B infection. Hepatitis C Blood testing is recommended for:  Everyone born from [birth year] through 1966.  Anyone with known risk factors for hepatitis C.  Sexually Transmitted Diseases (STDs)  You should be screened each year for STDs including gonorrhea and chlamydia if: ? You are sexually active and are younger than 43 years of age. ? You are older than 43 years of age and your health care provider tells you that you are at risk for this type of infection. ? Your sexual activity has changed since you were last screened and you are at an increased risk for chlamydia or gonorrhea. Ask your health care provider if you are at risk.  Talk with your health care provider about whether you are at high risk of being infected with HIV. Your health care provider may recommend a prescription medicine to help prevent HIV infection.  What else can I do?  Schedule regular health, dental, and eye exams.  Stay current with your vaccines (immunizations).  Do not use any  tobacco products, such as cigarettes, chewing tobacco, and e-cigarettes. If you need help quitting, ask your health care provider.  Limit alcohol intake to no more than 2 drinks per day. One drink equals 12 ounces of beer, 5 ounces of wine, or 1 ounces of hard liquor.  Do not use street drugs.  Do not share needles.  Ask your health care provider for help if you need support or information about quitting drugs.  Tell your health care provider if you often feel depressed.  Tell your health care provider if you have ever been abused or do not feel safe at home. This information is not intended to replace advice given to you by your health care provider. Make sure you discuss any questions you have with your health care provider. Document Released: 12/31/2007 Document Revised: 03/02/2016 Document Reviewed: 04/07/2015 Elsevier Interactive Patient Education  [employer].

## 2018-01-05 NOTE — Progress Notes (Signed)
Name: Donald Glass   MRN: [id number]    DOB: [birth year]   Date:01/05/2018       Progress Note  Subjective  Chief Complaint  Chief Complaint  Patient presents with  . CPE    fasting    HPI  Patient presents for annual CPE.  USPSTF grade A and B recommendations:  Diet, Exercise: trying to increase his exercise but still not very active, admits he should work on improving his diet and eat less salt  Depression: no concerns for anxiety or depression Depression screen [facility] 2/9 07/06/2017 02/06/2013  Decreased Interest 0 0  Down, Depressed, Hopeless 0 0  PHQ - 2 Score 0 0   Hypertension -maintained on lisinopril 40, amlodipine 10 Reports daily medication compliance without noted adverse medication effects. Reports he occasionally checks his blood pressure readings at home with recent readings 130s-140/80s.  BP Readings from Last 3 Encounters:  01/05/18 140/82  10/04/17 (!) 160/90  07/06/17 (!) 156/88   Obesity: Wt Readings from Last 3 Encounters:  01/05/18 179 lb (81.2 kg)  10/04/17 188 lb 1.9 oz (85.3 kg)  07/06/17 183 lb (83 kg)   BMI Readings from Last 3 Encounters:  01/05/18 22.98 kg/m  10/04/17 24.15 kg/m  07/06/17 23.50 kg/m   Lipids: lipid panel today Lab Results  Component Value Date   CHOL 192 08/18/2015   CHOL 208 (H) 02/06/2013   Lab Results  Component Value Date   HDL 44.60 08/18/2015   HDL 44.70 02/06/2013   Lab Results  Component Value Date   LDLCALC 115 (H) 08/18/2015   Lab Results  Component Value Date   TRIG 162.0 (H) 08/18/2015   TRIG 243.0 (H) 02/06/2013   Lab Results  Component Value Date   CHOLHDL 4 08/18/2015   CHOLHDL 5 02/06/2013   Lab Results  Component Value Date   LDLDIRECT 134.1 02/06/2013   Glucose: A1c today Glucose, Bld  Date Value Ref Range Status  08/09/2016 106 (H) 65 - 99 mg/dL Final  08/18/2015 103 (H) 70 - 99 mg/dL Final  02/06/2013 94 70 - 99 mg/dL Final   Alcohol: few drinks a week Tobacco use: no,  quit 3-4 years ago  STD testing and prevention (chl/gon/syphilis): no concerns, declines screening HIV: screening today   Skin cancer: routinely wears sunscreen   Colorectal cancer: No personal or family history of colon ca, no abdominal pain, no bowel changes, no rectal bleeding  Aspirin: taking 81 daily ECG:  Not indicated  Vaccination: up to date  Advanced Care Planning: A voluntary discussion about advance care planning including the explanation and discussion of advance directives.  Discussed health care proxy and Living will, and the patient DOES NOT have a living will at present time. If patient does have living will, I have requested they bring this to the clinic to be scanned in to their chart.  Patient Active Problem List   Diagnosis Date Noted  . Migraine headache 09/05/2016  . Essential hypertension 08/16/2016  . Routine general medical examination at a health care facility 08/18/2015  . Tobacco use disorder, continuous 08/18/2015    Past Surgical History:  Procedure Laterality Date  . NO PAST SURGERIES      Family History  Problem Relation Age of Onset  . Hypertension Mother   . Arthritis Maternal Grandmother   . Cancer Paternal Grandmother        breast  . Prostate cancer Father   . Heart disease Paternal Uncle   . Diabetes Paternal  Uncle   . Stroke Paternal Uncle     Social History   Socioeconomic History  . Marital status: Married    Spouse name: Debroah Loop  . Number of children: 4  . Years of education: 43  . Highest education level: Not on file  Occupational History  . Occupation: Radiation protection practitioner   Social Needs  . Financial resource strain: Not on file  . Food insecurity:    Worry: Not on file    Inability: Not on file  . Transportation needs:    Medical: Not on file    Non-medical: Not on file  Tobacco Use  . Smoking status: Former Smoker    Packs/day: 1.00    Years: 20.00    Pack years: 20.00    Types: Cigarettes  . Smokeless  tobacco: Never Used  Substance and Sexual Activity  . Alcohol use: Yes    Alcohol/week: 10.8 oz    Types: 18 Cans of beer per week  . Drug use: Yes    Frequency: 1.0 times per week    Types: Marijuana  . Sexual activity: Yes    Birth control/protection: Condom  Lifestyle  . Physical activity:    Days per week: Not on file    Minutes per session: Not on file  . Stress: Not on file  Relationships  . Social connections:    Talks on phone: Not on file    Gets together: Not on file    Attends religious service: Not on file    Active member of club or organization: Not on file    Attends meetings of clubs or organizations: Not on file    Relationship status: Not on file  . Intimate partner violence:    Fear of current or ex partner: Not on file    Emotionally abused: Not on file    Physically abused: Not on file    Forced sexual activity: Not on file  Other Topics Concern  . Not on file  Social History Narrative   Lives with wife and children   Caffeine use: Coffee/soda/tea daily   Fun: Watch football, movies, video games     Current Outpatient Medications:  .  amLODipine (NORVASC) 10 MG tablet, Take 0.5 tablets (5 mg total) by mouth daily., Disp: 90 tablet, Rfl: 1 .  aspirin 81 MG tablet, Take 81 mg by mouth daily., Disp: , Rfl:  .  ibuprofen (ADVIL,MOTRIN) 200 MG tablet, Take 600 mg by mouth every 6 (six) hours as needed for headache or moderate pain., Disp: , Rfl:  .  lisinopril (PRINIVIL,ZESTRIL) 40 MG tablet, Take 1 tablet (40 mg total) by mouth daily., Disp: 90 tablet, Rfl: 1  No Known Allergies   ROS  Constitutional: Negative for fever or weight change.  Respiratory: Negative for cough and shortness of breath.   Cardiovascular: Negative for chest pain or palpitations.  Gastrointestinal: Negative for abdominal pain, no bowel changes.  Musculoskeletal: Negative for gait problem or joint swelling.  Skin: Negative for rash.  Neurological: Negative for dizziness or  headache.  No other specific complaints in a complete review of systems (except as listed in HPI above).   Objective  Vitals:   01/05/18 1403  BP: 140/82  Pulse: 75  Resp: 16  Temp: 98.6 F (37 C)  TempSrc: Oral  SpO2: 96%  Weight: 179 lb (81.2 kg)  Height: 6' 2"  (1.88 m)    Body mass index is 22.98 kg/m.  Physical Exam Vital signs reviewed. Constitutional: Patient appears  well-developed and well-nourished. No distress.  HENT: Head: Normocephalic and atraumatic. Ears: B TMs ok, no erythema or effusion; Nose: Nose normal. Mouth/Throat: Oropharynx is clear and moist. No oropharyngeal exudate.  Eyes: Conjunctivae and EOM are normal. Pupils are equal, round, and reactive to light. No scleral icterus.  Neck: Normal range of motion. Neck supple. No cervical adenopathy. No thyromegaly present.  Cardiovascular: Normal rate, regular rhythm and normal heart sounds.  No murmur heard. No BLE edema. Distal pulses intact. Pulmonary/Chest: Effort normal and breath sounds normal. No respiratory distress. Abdominal: Soft. Bowel sounds are normal, no distension. There is no tenderness. no masses Musculoskeletal: Normal range of motion, no joint effusions. No gross deformities Neurological: he is alert and oriented to person, place, and time. No cranial nerve deficit. Coordination, balance, strength, speech and gait are normal.  Skin: Skin is warm and dry. No rash noted. No erythema.  Psychiatric: Patient has a normal mood and affect. behavior is normal. Judgment and thought content normal.  Assessment & Plan RTC in 6 months for F/U: HTN-make sure blood pressure remains stable

## 2018-01-06 LAB — HIV ANTIBODY (ROUTINE TESTING W REFLEX): HIV 1&2 Ab, 4th Generation: NONREACTIVE

## 2018-03-28 ENCOUNTER — Other Ambulatory Visit: Payer: Self-pay | Admitting: *Deleted

## 2018-03-28 MED ORDER — LISINOPRIL 40 MG PO TABS: 40.0000 mg | ORAL_TABLET | Freq: Every day | ORAL | 1 refills | Status: DC

## 2018-08-27 ENCOUNTER — Other Ambulatory Visit: Payer: Self-pay | Admitting: Emergency Medicine

## 2018-08-27 DIAGNOSIS — I10 Essential (primary) hypertension: Secondary | ICD-10-CM

## 2018-08-27 MED ORDER — AMLODIPINE BESYLATE 10 MG PO TABS: 10.0000 mg | ORAL_TABLET | Freq: Every day | ORAL | 0 refills | Status: DC

## 2018-09-24 ENCOUNTER — Other Ambulatory Visit: Payer: Self-pay | Admitting: *Deleted

## 2018-09-24 MED ORDER — LISINOPRIL 40 MG PO TABS: 40.0000 mg | ORAL_TABLET | Freq: Every day | ORAL | 0 refills | Status: DC

## 2018-11-28 ENCOUNTER — Other Ambulatory Visit: Payer: Self-pay | Admitting: *Deleted

## 2018-11-28 DIAGNOSIS — I10 Essential (primary) hypertension: Secondary | ICD-10-CM

## 2018-11-28 MED ORDER — AMLODIPINE BESYLATE 10 MG PO TABS: 10.0000 mg | ORAL_TABLET | Freq: Every day | ORAL | 0 refills | Status: DC

## 2018-11-28 NOTE — Telephone Encounter (Signed)
Received refill request for Amlodipine 10 mg. Patient's LOV was 01/05/18 with Gayla Medicus, NP. She wanted him back in 6 mo. Left mess for patient to call back to schedule virtual visit with Jodi Mourning, NP. Refill sent for # 30. See meds. CRM created.

## 2019-01-08 ENCOUNTER — Encounter: Payer: 59 | Admitting: Family

## 2019-01-15 ENCOUNTER — Encounter: Payer: 59 | Admitting: Family

## 2019-01-23 ENCOUNTER — Ambulatory Visit (INDEPENDENT_AMBULATORY_CARE_PROVIDER_SITE_OTHER): Payer: 59 | Admitting: Family

## 2019-01-23 ENCOUNTER — Other Ambulatory Visit (INDEPENDENT_AMBULATORY_CARE_PROVIDER_SITE_OTHER): Payer: 59

## 2019-01-23 ENCOUNTER — Other Ambulatory Visit: Payer: Self-pay

## 2019-01-23 ENCOUNTER — Encounter: Payer: Self-pay | Admitting: Family

## 2019-01-23 VITALS — BP 128/84 | HR 89 | Temp 98.1°F | Ht 74.0 in | Wt 174.0 lb

## 2019-01-23 DIAGNOSIS — I10 Essential (primary) hypertension: Secondary | ICD-10-CM

## 2019-01-23 DIAGNOSIS — Z125 Encounter for screening for malignant neoplasm of prostate: Secondary | ICD-10-CM

## 2019-01-23 DIAGNOSIS — Z0000 Encounter for general adult medical examination without abnormal findings: Secondary | ICD-10-CM

## 2019-01-23 DIAGNOSIS — Z13220 Encounter for screening for lipoid disorders: Secondary | ICD-10-CM

## 2019-01-23 LAB — COMPREHENSIVE METABOLIC PANEL
ALT: 23 U/L (ref 0–53)
AST: 25 U/L (ref 0–37)
Albumin: 4.7 g/dL (ref 3.5–5.2)
Alkaline Phosphatase: 55 U/L (ref 39–117)
BUN: 10 mg/dL (ref 6–23)
CO2: 28 mEq/L (ref 19–32)
Calcium: 9.7 mg/dL (ref 8.4–10.5)
Chloride: 101 mEq/L (ref 96–112)
Creatinine, Ser: 0.95 mg/dL (ref 0.40–1.50)
GFR: 104.03 mL/min (ref >60.00)
Glucose, Bld: 96 mg/dL (ref 70–99)
Potassium: 4.2 mEq/L (ref 3.5–5.1)
Sodium: 137 mEq/L (ref 135–145)
Total Bilirubin: 0.4 mg/dL (ref 0.2–1.2)
Total Protein: 7.4 g/dL (ref 6.0–8.3)

## 2019-01-23 LAB — LIPID PANEL
Cholesterol: 230 mg/dL — ABNORMAL HIGH (ref 0–200)
HDL: 56.5 mg/dL (ref >39.00)
LDL Cholesterol: 144 mg/dL — ABNORMAL HIGH (ref 0–99)
NonHDL: 173.06
Total CHOL/HDL Ratio: 4
Triglycerides: 147 mg/dL (ref 0.0–149.0)
VLDL: 29.4 mg/dL (ref 0.0–40.0)

## 2019-01-23 LAB — CBC WITH DIFFERENTIAL/PLATELET
Basophils Absolute: 0.1 10*3/uL (ref 0.0–0.1)
Basophils Relative: 1.4 % (ref 0.0–3.0)
Eosinophils Absolute: 0.2 10*3/uL (ref 0.0–0.7)
Eosinophils Relative: 2.4 % (ref 0.0–5.0)
HCT: 46.1 % (ref 39.0–52.0)
Hemoglobin: 15.2 g/dL (ref 13.0–17.0)
Lymphocytes Relative: 25.9 % (ref 12.0–46.0)
Lymphs Abs: 1.7 10*3/uL (ref 0.7–4.0)
MCHC: 33.1 g/dL (ref 30.0–36.0)
MCV: 89.7 fl (ref 78.0–100.0)
Monocytes Absolute: 0.6 10*3/uL (ref 0.1–1.0)
Monocytes Relative: 9.4 % (ref 3.0–12.0)
Neutro Abs: 4 10*3/uL (ref 1.4–7.7)
Neutrophils Relative %: 60.9 % (ref 43.0–77.0)
Platelets: 249 10*3/uL (ref 150.0–400.0)
RBC: 5.13 Mil/uL (ref 4.22–5.81)
RDW: 12.5 % (ref 11.5–15.5)
WBC: 6.6 10*3/uL (ref 4.0–10.5)

## 2019-01-23 LAB — PSA: PSA: 1.16 ng/mL (ref 0.10–4.00)

## 2019-01-23 LAB — VITAMIN D 25 HYDROXY (VIT D DEFICIENCY, FRACTURES): VITD: 18.2 ng/mL — ABNORMAL LOW (ref 30.00–100.00)

## 2019-01-23 MED ORDER — AMLODIPINE BESYLATE 10 MG PO TABS: 10.0000 mg | ORAL_TABLET | Freq: Every day | ORAL | 3 refills | Status: DC

## 2019-01-23 MED ORDER — LISINOPRIL 40 MG PO TABS: 40.0000 mg | ORAL_TABLET | Freq: Every day | ORAL | 3 refills | Status: DC

## 2019-01-23 NOTE — Patient Instructions (Signed)
Health Maintenance, Male Adopting a healthy lifestyle and getting preventive care are important in promoting health and wellness. Ask your health care provider about:  The right schedule for you to have regular tests and exams.  Things you can do on your own to prevent diseases and keep yourself healthy. What should I know about diet, weight, and exercise? Eat a healthy diet   Eat a diet that includes plenty of vegetables, fruits, low-fat dairy products, and lean protein.  Do not eat a lot of foods that are high in solid fats, added sugars, or sodium. Maintain a healthy weight Body mass index (BMI) is a measurement that can be used to identify possible weight problems. It estimates body fat based on height and weight. Your health care provider can help determine your BMI and help you achieve or maintain a healthy weight. Get regular exercise Get regular exercise. This is one of the most important things you can do for your health. Most adults should:  Exercise for at least 150 minutes each week. The exercise should increase your heart rate and make you sweat (moderate-intensity exercise).  Do strengthening exercises at least twice a week. This is in addition to the moderate-intensity exercise.  Spend less time sitting. Even light physical activity can be beneficial. Watch cholesterol and blood lipids Have your blood tested for lipids and cholesterol at 44 years of age, then have this test every 5 years. You may need to have your cholesterol levels checked more often if:  Your lipid or cholesterol levels are high.  You are older than 44 years of age.  You are at high risk for heart disease. What should I know about cancer screening? Many types of cancers can be detected early and may often be prevented. Depending on your health history and family history, you may need to have cancer screening at various ages. This may include screening for:  Colorectal cancer.  Prostate  cancer.  Skin cancer.  Lung cancer. What should I know about heart disease, diabetes, and high blood pressure? Blood pressure and heart disease  High blood pressure causes heart disease and increases the risk of stroke. This is more likely to develop in people who have high blood pressure readings, are of African descent, or are overweight.  Talk with your health care provider about your target blood pressure readings.  Have your blood pressure checked: ? Every 3-5 years if you are 30-46 years of age. ? Every year if you are 78 years old or older.  If you are between the ages of 69 and 47 and are a current or former smoker, ask your health care provider if you should have a one-time screening for abdominal aortic aneurysm (AAA). Diabetes Have regular diabetes screenings. This checks your fasting blood sugar level. Have the screening done:  Once every three years after age 60 if you are at a normal weight and have a low risk for diabetes.  More often and at a younger age if you are overweight or have a high risk for diabetes. What should I know about preventing infection? Hepatitis B If you have a higher risk for hepatitis B, you should be screened for this virus. Talk with your health care provider to find out if you are at risk for hepatitis B infection. Hepatitis C Blood testing is recommended for:  Everyone born from [birth year] through 1966.  Anyone with known risk factors for hepatitis C. Sexually transmitted infections (STIs)  You should be screened each year  for STIs, including gonorrhea and chlamydia, if: ? You are sexually active and are younger than 44 years of age. ? You are older than 44 years of age and your health care provider tells you that you are at risk for this type of infection. ? Your sexual activity has changed since you were last screened, and you are at increased risk for chlamydia or gonorrhea. Ask your health care provider if you are at risk.  Ask your  health care provider about whether you are at high risk for HIV. Your health care provider may recommend a prescription medicine to help prevent HIV infection. If you choose to take medicine to prevent HIV, you should first get tested for HIV. You should then be tested every 3 months for as long as you are taking the medicine. Follow these instructions at home: Lifestyle  Do not use any products that contain nicotine or tobacco, such as cigarettes, e-cigarettes, and chewing tobacco. If you need help quitting, ask your health care provider.  Do not use street drugs.  Do not share needles.  Ask your health care provider for help if you need support or information about quitting drugs. Alcohol use  Do not drink alcohol if your health care provider tells you not to drink.  If you drink alcohol: ? Limit how much you have to 0-2 drinks a day. ? Be aware of how much alcohol is in your drink. In the [place] one drink equals one 12 oz bottle of beer (355 mL), one 5 oz glass of wine (148 mL), or one 1 oz glass of hard liquor (44 mL). General instructions  Schedule regular health, dental, and eye exams.  Stay current with your vaccines.  Tell your health care provider if: ? You often feel depressed. ? You have ever been abused or do not feel safe at home. Summary  Adopting a healthy lifestyle and getting preventive care are important in promoting health and wellness.  Follow your health care provider's instructions about healthy diet, exercising, and getting tested or screened for diseases.  Follow your health care provider's instructions on monitoring your cholesterol and blood pressure. This information is not intended to replace advice given to you by your health care provider. Make sure you discuss any questions you have with your health care provider. Document Released: 12/31/2007 Document Revised: 06/27/2018 Document Reviewed: 06/27/2018 Elsevier Patient Education  2020 [employer].

## 2019-01-23 NOTE — Progress Notes (Signed)
Donald Glass is a 43 y.o. male with the following history as recorded in [facility]:  Patient Active Problem List   Diagnosis Date Noted  . Migraine headache 09/05/2016  . Essential hypertension 08/16/2016  . Routine general medical examination at a health care facility 08/18/2015  . Tobacco use disorder, continuous 08/18/2015    Current Outpatient Medications  Medication Sig Dispense Refill  . amLODipine (NORVASC) 10 MG tablet Take 1 tablet (10 mg total) by mouth daily. 90 tablet 3  . aspirin 81 MG tablet Take 81 mg by mouth daily.    Marland Kitchen ibuprofen (ADVIL,MOTRIN) 200 MG tablet Take 600 mg by mouth every 6 (six) hours as needed for headache or moderate pain.    Marland Kitchen lisinopril (ZESTRIL) 40 MG tablet Take 1 tablet (40 mg total) by mouth daily. 90 tablet 3   No current facility-administered medications for this visit.     Allergies: Patient has no known allergies.  Past Medical History:  Diagnosis Date  . Hypertension   . Migraine headache 09/05/2016    Past Surgical History:  Procedure Laterality Date  . NO PAST SURGERIES      Family History  Problem Relation Age of Onset  . Hypertension Mother   . Arthritis Maternal Grandmother   . Cancer Paternal Grandmother        breast  . Prostate cancer Father   . Heart disease Paternal Uncle   . Diabetes Paternal Uncle   . Stroke Paternal Uncle     Social History   Tobacco Use  . Smoking status: Former Smoker    Packs/day: 1.00    Years: 20.00    Pack years: 20.00    Types: Cigarettes  . Smokeless tobacco: Never Used  Substance Use Topics  . Alcohol use: Yes    Alcohol/week: 18.0 standard drinks    Types: 18 Cans of beer per week    Subjective:  Patient presents for yearly CPE; in baseline state of health; exercises 2 x per week; has been working on weight loss- planned weight loss of 15 pounds since 2019; Sleeps well- works 2 weeks day shift/  2 weeks night shift; Sees eye doctor and dentist regularly;  Review of Systems   Constitutional: Negative.   HENT: Negative.   Eyes: Negative.   Respiratory: Negative.   Cardiovascular: Negative.   Gastrointestinal: Negative.   Genitourinary: Negative.   Musculoskeletal: Negative.   Skin: Negative.   Neurological: Negative.   Endo/Heme/Allergies: Negative.   Psychiatric/Behavioral: Negative.      Objective:  Vitals:   01/23/19 1239  BP: 128/84  Pulse: 89  Temp: 98.1 F (36.7 C)  TempSrc: Oral  SpO2: 98%  Weight: 174 lb (78.9 kg)  Height: 6' 2" (1.88 m)    General: Well developed, well nourished, in no acute distress  Skin : Warm and dry.  Head: Normocephalic and atraumatic  Eyes: Sclera and conjunctiva clear; pupils round and reactive to light; extraocular movements intact  Ears: External normal; canals clear; tympanic membranes normal  Oropharynx: Pink, supple. No suspicious lesions  Neck: Supple without thyromegaly, adenopathy  Lungs: Respirations unlabored; clear to auscultation bilaterally without wheeze, rales, rhonchi  CVS exam: normal rate and regular rhythm.  Abdomen: Soft; nontender; nondistended; normoactive bowel sounds; no masses or hepatosplenomegaly  Musculoskeletal: No deformities; no active joint inflammation  Extremities: No edema, cyanosis, clubbing  Vessels: Symmetric bilaterally  Neurologic: Alert and oriented; speech intact; face symmetrical; moves all extremities well; CNII-XII intact without focal deficit  Assessment:  1. PE (physical exam), annual   2. Essential hypertension   3. Lipid screening   4. Prostate cancer screening     Plan:  Age appropriate preventive healthcare needs addressed; encouraged regular eye doctor and dental exams; encouraged regular exercise; will update labs and refills as needed today; follow-up to be determined;  No follow-ups on file.  Orders Placed This Encounter  Procedures  . CBC w/Diff    Standing Status:   Future    Number of Occurrences:   1    Standing Expiration Date:   01/23/2020   . Comp Met (CMET)    Standing Status:   Future    Number of Occurrences:   1    Standing Expiration Date:   01/23/2020  . Lipid panel    Standing Status:   Future    Number of Occurrences:   1    Standing Expiration Date:   01/23/2020  . Vitamin D (25 hydroxy)    Standing Status:   Future    Number of Occurrences:   1    Standing Expiration Date:   01/23/2020  . PSA    Standing Status:   Future    Number of Occurrences:   1    Standing Expiration Date:   01/23/2020    Requested Prescriptions   Signed Prescriptions Disp Refills  . lisinopril (ZESTRIL) 40 MG tablet 90 tablet 3    Sig: Take 1 tablet (40 mg total) by mouth daily.  Marland Kitchen amLODipine (NORVASC) 10 MG tablet 90 tablet 3    Sig: Take 1 tablet (10 mg total) by mouth daily.

## 2019-01-24 ENCOUNTER — Other Ambulatory Visit: Payer: Self-pay | Admitting: Family

## 2019-01-24 MED ORDER — VITAMIN D (ERGOCALCIFEROL) 1.25 MG (50000 UNIT) PO CAPS: 50000.0000 [IU] | ORAL_CAPSULE | ORAL | 0 refills | Status: AC

## 2020-01-28 ENCOUNTER — Ambulatory Visit (INDEPENDENT_AMBULATORY_CARE_PROVIDER_SITE_OTHER): Payer: 59 | Admitting: Family

## 2020-01-28 ENCOUNTER — Other Ambulatory Visit: Payer: Self-pay

## 2020-01-28 ENCOUNTER — Encounter: Payer: Self-pay | Admitting: Family

## 2020-01-28 VITALS — BP 140/80 | HR 92 | Temp 98.4°F | Ht 74.0 in | Wt 173.0 lb

## 2020-01-28 DIAGNOSIS — Z125 Encounter for screening for malignant neoplasm of prostate: Secondary | ICD-10-CM

## 2020-01-28 DIAGNOSIS — E559 Vitamin D deficiency, unspecified: Secondary | ICD-10-CM

## 2020-01-28 DIAGNOSIS — Z13220 Encounter for screening for lipoid disorders: Secondary | ICD-10-CM

## 2020-01-28 DIAGNOSIS — I10 Essential (primary) hypertension: Secondary | ICD-10-CM | POA: Diagnosis not present

## 2020-01-28 DIAGNOSIS — Z0000 Encounter for general adult medical examination without abnormal findings: Secondary | ICD-10-CM | POA: Diagnosis not present

## 2020-01-28 MED ORDER — AMLODIPINE BESYLATE 10 MG PO TABS: 10.0000 mg | ORAL_TABLET | Freq: Every day | ORAL | 0 refills | Status: DC

## 2020-01-28 MED ORDER — LISINOPRIL 40 MG PO TABS: 40.0000 mg | ORAL_TABLET | Freq: Every day | ORAL | 0 refills | Status: DC

## 2020-01-28 NOTE — Progress Notes (Signed)
Donald Glass is a 44 y.o. male with the following history as recorded in Epic[facility]:  Patient Active Problem List   Diagnosis Date Noted   Migraine headache 09/05/2016   Essential hypertension 08/16/2016   Routine general medical examination at a health care facility 08/18/2015   Tobacco use disorder, continuous 08/18/2015    Current Outpatient Medications  Medication Sig Dispense Refill   amLODipine (NORVASC) 10 MG tablet Take 1 tablet (10 mg total) by mouth daily. 90 tablet 0   ibuprofen (ADVIL,MOTRIN) 200 MG tablet Take 600 mg by mouth every 6 (six) hours as needed for headache or moderate pain.     lisinopril (ZESTRIL) 40 MG tablet Take 1 tablet (40 mg total) by mouth daily. 90 tablet 0   No current facility-administered medications for this visit.    Allergies: Other  Past Medical History:  Diagnosis Date   Hypertension    Migraine headache 09/05/2016    Past Surgical History:  Procedure Laterality Date   NO PAST SURGERIES      Family History  Problem Relation Age of Onset   Hypertension Mother    Arthritis Maternal Grandmother    Cancer Paternal Grandmother        breast   Prostate cancer Father    Heart disease Paternal Uncle    Diabetes Paternal Uncle    Stroke Paternal Uncle     Social History   Tobacco Use   Smoking status: Former Smoker    Packs/day: 1.00    Years: 20.00    Pack years: 20.00    Types: Cigarettes   Smokeless tobacco: Never Used  Substance Use Topics   Alcohol use: Yes    Alcohol/week: 18.0 standard drinks    Types: 18 Cans of beer per week    Subjective:  Presents for yearly CPE; in baseline state of health today; up to date on dental and vision exams; exercising daily;  Review of Systems  Constitutional: Negative.   HENT: Negative.   Eyes: Negative.   Respiratory: Negative.   Cardiovascular: Negative.   Gastrointestinal: Negative.   Genitourinary: Negative.   Musculoskeletal: Negative.   Skin: Negative.    Neurological: Negative.   Endo/Heme/Allergies: Negative.   Psychiatric/Behavioral: Negative.       Objective:  Vitals:   01/28/20 0917  BP: 140/80  Pulse: 92  Temp: 98.4 F (36.9 C)  TempSrc: Oral  SpO2: 98%  Weight: 173 lb (78.5 kg)  Height: _0  (1.88 m)    General: Well developed, well nourished, in no acute distress  Skin : Warm and dry.  Head: Normocephalic and atraumatic  Eyes: Sclera and conjunctiva clear; pupils round and reactive to light; extraocular movements intact  Ears: External normal; canals clear; tympanic membranes normal  Oropharynx: Pink, supple. No suspicious lesions  Neck: Supple without thyromegaly, adenopathy  Lungs: Respirations unlabored; clear to auscultation bilaterally without wheeze, rales, rhonchi  CVS exam: normal rate and regular rhythm.  Abdomen: Soft; nontender; nondistended; normoactive bowel sounds; no masses or hepatosplenomegaly  Musculoskeletal: No deformities; no active joint inflammation  Extremities: No edema, cyanosis, clubbing  Vessels: Symmetric bilaterally  Neurologic: Alert and oriented; speech intact; face symmetrical; moves all extremities well; CNII-XII intact without focal deficit   Assessment:  1. PE (physical exam), annual   2. Essential hypertension   3. Lipid screening   4. Vitamin D deficiency   5. Prostate cancer screening     Plan:  Age appropriate preventive healthcare needs addressed; encouraged regular eye doctor and  dental exams; encouraged regular exercise; will update labs and refills as needed today; follow-up to be determined; Patient will start checking his blood pressure and forward readings for review within 2 weeks; may need to adjust medication;   This visit occurred during the SARS-CoV-2 public health emergency.  Safety protocols were in place, including screening questions prior to the visit, additional usage of staff PPE, and extensive cleaning of exam room while observing appropriate contact  time as indicated for disinfecting solutions.      No follow-ups on file.  Orders Placed This Encounter  Procedures   Vitamin D (25 hydroxy)   CBC with Differential/Platelet    Standing Status:   Future    Number of Occurrences:   1    Standing Expiration Date:   01/27/2021   Comp Met (CMET)    Standing Status:   Future    Number of Occurrences:   1    Standing Expiration Date:   01/27/2021   Lipid panel    Standing Status:   Future    Number of Occurrences:   1    Standing Expiration Date:   01/27/2021   PSA    Standing Status:   Future    Number of Occurrences:   1    Standing Expiration Date:   01/27/2021   TSH    Standing Status:   Future    Number of Occurrences:   1    Standing Expiration Date:   01/27/2021    Requested Prescriptions   Signed Prescriptions Disp Refills   amLODipine (NORVASC) 10 MG tablet 90 tablet 0    Sig: Take 1 tablet (10 mg total) by mouth daily.   lisinopril (ZESTRIL) 40 MG tablet 90 tablet 0    Sig: Take 1 tablet (40 mg total) by mouth daily.

## 2020-01-29 ENCOUNTER — Other Ambulatory Visit: Payer: Self-pay | Admitting: Family

## 2020-01-29 DIAGNOSIS — R7989 Other specified abnormal findings of blood chemistry: Secondary | ICD-10-CM

## 2020-01-29 LAB — LIPID PANEL
Cholesterol: 200 mg/dL — ABNORMAL HIGH (ref <200)
HDL: 63 mg/dL (ref >=40)
LDL Cholesterol (Calc): 121 mg/dL (calc) — ABNORMAL HIGH
Non-HDL Cholesterol (Calc): 137 mg/dL (calc) — ABNORMAL HIGH (ref <130)
Total CHOL/HDL Ratio: 3.2 (calc) (ref <5.0)
Triglycerides: 68 mg/dL (ref <150)

## 2020-01-29 LAB — CBC WITH DIFFERENTIAL/PLATELET
Absolute Monocytes: 616 cells/uL (ref 200–950)
Basophils Absolute: 38 cells/uL (ref 0–200)
Basophils Relative: 0.7 %
Eosinophils Absolute: 92 cells/uL (ref 15–500)
Eosinophils Relative: 1.7 %
HCT: 46.7 % (ref 38.5–50.0)
Hemoglobin: 15.2 g/dL (ref 13.2–17.1)
Lymphs Abs: 1507 cells/uL (ref 850–3900)
MCH: 29.5 pg (ref 27.0–33.0)
MCHC: 32.5 g/dL (ref 32.0–36.0)
MCV: 90.5 fL (ref 80.0–100.0)
MPV: 10.5 fL (ref 7.5–12.5)
Monocytes Relative: 11.4 %
Neutro Abs: 3148 cells/uL (ref 1500–7800)
Neutrophils Relative %: 58.3 %
Platelets: 266 10*3/uL (ref 140–400)
RBC: 5.16 10*6/uL (ref 4.20–5.80)
RDW: 12.1 % (ref 11.0–15.0)
Total Lymphocyte: 27.9 %
WBC: 5.4 10*3/uL (ref 3.8–10.8)

## 2020-01-29 LAB — COMPREHENSIVE METABOLIC PANEL
AG Ratio: 1.7 (calc) (ref 1.0–2.5)
ALT: 21 U/L (ref 9–46)
AST: 23 U/L (ref 10–40)
Albumin: 4.5 g/dL (ref 3.6–5.1)
Alkaline phosphatase (APISO): 48 U/L (ref 36–130)
BUN: 13 mg/dL (ref 7–25)
CO2: 28 mmol/L (ref 20–32)
Calcium: 9.7 mg/dL (ref 8.6–10.3)
Chloride: 102 mmol/L (ref 98–110)
Creat: 1.01 mg/dL (ref 0.60–1.35)
Globulin: 2.7 g/dL (calc) (ref 1.9–3.7)
Glucose, Bld: 101 mg/dL — ABNORMAL HIGH (ref 65–99)
Potassium: 5.2 mmol/L (ref 3.5–5.3)
Sodium: 138 mmol/L (ref 135–146)
Total Bilirubin: 0.3 mg/dL (ref 0.2–1.2)
Total Protein: 7.2 g/dL (ref 6.1–8.1)

## 2020-01-29 LAB — TSH: TSH: 0.36 mIU/L — ABNORMAL LOW (ref 0.40–4.50)

## 2020-01-29 LAB — VITAMIN D 25 HYDROXY (VIT D DEFICIENCY, FRACTURES): Vit D, 25-Hydroxy: 23 ng/mL — ABNORMAL LOW (ref 30–100)

## 2020-01-29 LAB — PSA: PSA: 1.2 ng/mL (ref <=4.0)

## 2020-01-29 MED ORDER — VITAMIN D (ERGOCALCIFEROL) 1.25 MG (50000 UNIT) PO CAPS
50000.0000 [IU] | ORAL_CAPSULE | ORAL | 0 refills | Status: AC
Start: 2020-01-29 — End: 2020-04-16

## 2020-05-13 ENCOUNTER — Other Ambulatory Visit: Payer: Self-pay | Admitting: Family

## 2020-05-13 DIAGNOSIS — I10 Essential (primary) hypertension: Secondary | ICD-10-CM

## 2020-08-06 ENCOUNTER — Other Ambulatory Visit: Payer: Self-pay | Admitting: Family

## 2020-11-02 ENCOUNTER — Telehealth: Payer: Self-pay | Admitting: Family

## 2020-11-02 NOTE — Telephone Encounter (Signed)
Please remind him that he was due to get his thyroid level re-checked after last CPE in July; those orders are still in place. Has he been able to check his blood pressure as discussed; He needs OV here in July for CPE- does he want to stay at this office? If yes, let's get him set up with Dr. Quay Burow for CPE appointment please.

## 2020-11-02 NOTE — Telephone Encounter (Signed)
I have attempted to call pt and relay the message from the provider. He did not answer the phone so I left a message to call back.

## 2020-11-03 NOTE — Telephone Encounter (Signed)
Patient has called back and decided to move with Donald Glass. He has a physical set for 7.12.22

## 2020-11-03 NOTE — Telephone Encounter (Signed)
FYI to provider. Pt will follow to [facility]

## 2021-01-26 ENCOUNTER — Encounter: Payer: Self-pay | Admitting: Family

## 2021-01-26 ENCOUNTER — Ambulatory Visit (INDEPENDENT_AMBULATORY_CARE_PROVIDER_SITE_OTHER): Payer: 59 | Admitting: Family

## 2021-01-26 ENCOUNTER — Other Ambulatory Visit: Payer: Self-pay

## 2021-01-26 VITALS — BP 144/82 | HR 94 | Temp 98.0°F | Ht 74.0 in | Wt 181.6 lb

## 2021-01-26 DIAGNOSIS — Z13220 Encounter for screening for lipoid disorders: Secondary | ICD-10-CM

## 2021-01-26 DIAGNOSIS — R899 Unspecified abnormal finding in specimens from other organs, systems and tissues: Secondary | ICD-10-CM

## 2021-01-26 DIAGNOSIS — E559 Vitamin D deficiency, unspecified: Secondary | ICD-10-CM | POA: Diagnosis not present

## 2021-01-26 DIAGNOSIS — I10 Essential (primary) hypertension: Secondary | ICD-10-CM | POA: Diagnosis not present

## 2021-01-26 DIAGNOSIS — Z125 Encounter for screening for malignant neoplasm of prostate: Secondary | ICD-10-CM

## 2021-01-26 DIAGNOSIS — Z1211 Encounter for screening for malignant neoplasm of colon: Secondary | ICD-10-CM

## 2021-01-26 LAB — CBC WITH DIFFERENTIAL/PLATELET
Basophils Absolute: 0 10*3/uL (ref 0.0–0.1)
Basophils Relative: 0.7 % (ref 0.0–3.0)
Eosinophils Absolute: 0.1 10*3/uL (ref 0.0–0.7)
Eosinophils Relative: 2.4 % (ref 0.0–5.0)
HCT: 44.4 % (ref 39.0–52.0)
Hemoglobin: 14.8 g/dL (ref 13.0–17.0)
Lymphocytes Relative: 36.7 % (ref 12.0–46.0)
Lymphs Abs: 1.6 10*3/uL (ref 0.7–4.0)
MCHC: 33.3 g/dL (ref 30.0–36.0)
MCV: 91.1 fl (ref 78.0–100.0)
Monocytes Absolute: 0.4 10*3/uL (ref 0.1–1.0)
Monocytes Relative: 10 % (ref 3.0–12.0)
Neutro Abs: 2.2 10*3/uL (ref 1.4–7.7)
Neutrophils Relative %: 50.2 % (ref 43.0–77.0)
Platelets: 243 10*3/uL (ref 150.0–400.0)
RBC: 4.88 Mil/uL (ref 4.22–5.81)
RDW: 12.9 % (ref 11.5–15.5)
WBC: 4.5 10*3/uL (ref 4.0–10.5)

## 2021-01-26 LAB — VITAMIN D 25 HYDROXY (VIT D DEFICIENCY, FRACTURES): VITD: 37.67 ng/mL (ref 30.00–100.00)

## 2021-01-26 LAB — LIPID PANEL
Cholesterol: 226 mg/dL — ABNORMAL HIGH (ref 0–200)
HDL: 48.5 mg/dL (ref >39.00)
LDL Cholesterol: 144 mg/dL — ABNORMAL HIGH (ref 0–99)
NonHDL: 177.64
Total CHOL/HDL Ratio: 5
Triglycerides: 167 mg/dL — ABNORMAL HIGH (ref 0.0–149.0)
VLDL: 33.4 mg/dL (ref 0.0–40.0)

## 2021-01-26 LAB — COMPREHENSIVE METABOLIC PANEL
ALT: 27 U/L (ref 0–53)
AST: 26 U/L (ref 0–37)
Albumin: 4.6 g/dL (ref 3.5–5.2)
Alkaline Phosphatase: 44 U/L (ref 39–117)
BUN: 13 mg/dL (ref 6–23)
CO2: 28 mEq/L (ref 19–32)
Calcium: 9.6 mg/dL (ref 8.4–10.5)
Chloride: 103 mEq/L (ref 96–112)
Creatinine, Ser: 1.01 mg/dL (ref 0.40–1.50)
GFR: 89.26 mL/min (ref >60.00)
Glucose, Bld: 94 mg/dL (ref 70–99)
Potassium: 4.7 mEq/L (ref 3.5–5.1)
Sodium: 138 mEq/L (ref 135–145)
Total Bilirubin: 0.4 mg/dL (ref 0.2–1.2)
Total Protein: 7.2 g/dL (ref 6.0–8.3)

## 2021-01-26 LAB — TSH: TSH: 0.33 u[IU]/mL — ABNORMAL LOW (ref 0.35–5.50)

## 2021-01-26 LAB — PSA: PSA: 1.37 ng/mL (ref 0.10–4.00)

## 2021-01-26 MED ORDER — AMLODIPINE BESYLATE 10 MG PO TABS: 10.0000 mg | ORAL_TABLET | Freq: Every day | ORAL | 3 refills | Status: DC

## 2021-01-26 MED ORDER — LISINOPRIL 40 MG PO TABS: 40.0000 mg | ORAL_TABLET | Freq: Every day | ORAL | 3 refills | Status: DC

## 2021-01-26 NOTE — Progress Notes (Signed)
Donald Glass is a 45 y.o. male with the following history as recorded in Epic[facility]:  Patient Active Problem List   Diagnosis Date Noted   Migraine headache 09/05/2016   Essential hypertension 08/16/2016   Routine general medical examination at a health care facility 08/18/2015   Tobacco use disorder, continuous 08/18/2015    Current Outpatient Medications  Medication Sig Dispense Refill   ibuprofen (ADVIL,MOTRIN) 200 MG tablet Take 600 mg by mouth every 6 (six) hours as needed for headache or moderate pain.     amLODipine (NORVASC) 10 MG tablet Take 1 tablet (10 mg total) by mouth daily. 90 tablet 3   lisinopril (ZESTRIL) 40 MG tablet Take 1 tablet (40 mg total) by mouth daily. 90 tablet 3   No current facility-administered medications for this visit.    Allergies: Other  Past Medical History:  Diagnosis Date   Hypertension    Migraine headache 09/05/2016    Past Surgical History:  Procedure Laterality Date   NO PAST SURGERIES      Family History  Problem Relation Age of Onset   Hypertension Mother    Arthritis Maternal Grandmother    Cancer Paternal Grandmother        breast   Prostate cancer Father    Heart disease Paternal Uncle    Diabetes Paternal Uncle    Stroke Paternal Uncle     Social History   Tobacco Use   Smoking status: Former    Packs/day: 1.00    Years: 20.00    Pack years: 20.00    Types: Cigarettes   Smokeless tobacco: Never  Substance Use Topics   Alcohol use: Yes    Alcohol/week: 18.0 standard drinks    Types: 18 Cans of beer per week    Subjective:   Presents for a follow up on his hypertension and chronic care needs;  Notes he has not been taking him Amlodipine for the past few months- he notes the pharmacy did not fill it for him and he assumed that the medication was stopped;     Objective:  Vitals:   01/26/21 0844 01/26/21 0848  BP: (!) 160/70 (!) 144/82  Pulse: 94   Temp: 98 F (36.7 C)   TempSrc: Oral   SpO2: 97%    Weight: 181 lb 9.6 oz (82.4 kg)   Height: 6' 2"  (1.88 m)     General: Well developed, well nourished, in no acute distress  Skin : Warm and dry.  Head: Normocephalic and atraumatic  Eyes: Sclera and conjunctiva clear; pupils round and reactive to light; extraocular movements intact  Ears: External normal; canals clear; tympanic membranes normal  Oropharynx: Pink, supple. No suspicious lesions  Neck: Supple without thyromegaly, adenopathy  Lungs: Respirations unlabored; clear to auscultation bilaterally without wheeze, rales, rhonchi  CVS exam: normal rate and regular rhythm.  Abdomen: Soft; nontender; nondistended; normoactive bowel sounds; no masses or hepatosplenomegaly  Musculoskeletal: No deformities; no active joint inflammation  Extremities: No edema, cyanosis, clubbing  Vessels: Symmetric bilaterally  Neurologic: Alert and oriented; speech intact; face symmetrical; moves all extremities well; CNII-XII intact without focal deficit   Assessment:  1. Primary hypertension   2. Essential hypertension   3. Lipid screening   4. Prostate cancer screening   5. Vitamin D deficiency   6. Abnormal laboratory test result   7. Colon cancer screening     Plan:   Uncontrolled; re-start Amlodipine and continue Lisinopril; start checking blood pressure at work and follow up if  blood pressure consistently above 140/ 90. 3.   Check lipid panel today;  4.   Check PSA today; 5.   Check Vitamin D level; 7.   Cologuard ordered;   This visit occurred during the SARS-CoV-2 public health emergency.  Safety protocols were in place, including screening questions prior to the visit, additional usage of staff PPE, and extensive cleaning of exam room while observing appropriate contact time as indicated for disinfecting solutions.    No follow-ups on file.  Orders Placed This Encounter  Procedures   CBC with Differential/Platelet   Comp Met (CMET)   Lipid panel   PSA   Vitamin D (25 hydroxy)    TSH   Cologuard    Requested Prescriptions   Signed Prescriptions Disp Refills   amLODipine (NORVASC) 10 MG tablet 90 tablet 3    Sig: Take 1 tablet (10 mg total) by mouth daily.   lisinopril (ZESTRIL) 40 MG tablet 90 tablet 3    Sig: Take 1 tablet (40 mg total) by mouth daily.

## 2021-01-27 ENCOUNTER — Other Ambulatory Visit (INDEPENDENT_AMBULATORY_CARE_PROVIDER_SITE_OTHER): Payer: 59

## 2021-01-27 DIAGNOSIS — R7309 Other abnormal glucose: Secondary | ICD-10-CM | POA: Diagnosis not present

## 2021-01-27 LAB — HEMOGLOBIN A1C: Hgb A1c MFr Bld: 6.1 % (ref 4.6–6.5)

## 2021-02-16 LAB — COLOGUARD: Cologuard: NEGATIVE

## 2021-04-21 ENCOUNTER — Encounter: Payer: Self-pay | Admitting: Family

## 2022-03-03 ENCOUNTER — Encounter: Payer: Self-pay | Admitting: Family

## 2022-03-03 ENCOUNTER — Ambulatory Visit (INDEPENDENT_AMBULATORY_CARE_PROVIDER_SITE_OTHER): Payer: 59 | Admitting: Family

## 2022-03-03 VITALS — BP 140/88 | HR 79 | Temp 97.8°F | Ht 74.0 in | Wt 181.4 lb

## 2022-03-03 DIAGNOSIS — E785 Hyperlipidemia, unspecified: Secondary | ICD-10-CM

## 2022-03-03 DIAGNOSIS — Z0000 Encounter for general adult medical examination without abnormal findings: Secondary | ICD-10-CM

## 2022-03-03 DIAGNOSIS — R7309 Other abnormal glucose: Secondary | ICD-10-CM

## 2022-03-03 DIAGNOSIS — Z125 Encounter for screening for malignant neoplasm of prostate: Secondary | ICD-10-CM | POA: Diagnosis not present

## 2022-03-03 LAB — COMPREHENSIVE METABOLIC PANEL
ALT: 21 U/L (ref 0–53)
AST: 28 U/L (ref 0–37)
Albumin: 4.6 g/dL (ref 3.5–5.2)
Alkaline Phosphatase: 47 U/L (ref 39–117)
BUN: 17 mg/dL (ref 6–23)
CO2: 30 mEq/L (ref 19–32)
Calcium: 9.4 mg/dL (ref 8.4–10.5)
Chloride: 100 mEq/L (ref 96–112)
Creatinine, Ser: 0.96 mg/dL (ref 0.40–1.50)
GFR: 94.14 mL/min (ref >60.00)
Glucose, Bld: 79 mg/dL (ref 70–99)
Potassium: 3.9 mEq/L (ref 3.5–5.1)
Sodium: 138 mEq/L (ref 135–145)
Total Bilirubin: 0.7 mg/dL (ref 0.2–1.2)
Total Protein: 7.2 g/dL (ref 6.0–8.3)

## 2022-03-03 LAB — LIPID PANEL
Cholesterol: 220 mg/dL — ABNORMAL HIGH (ref 0–200)
HDL: 59.6 mg/dL (ref >39.00)
LDL Cholesterol: 145 mg/dL — ABNORMAL HIGH (ref 0–99)
NonHDL: 160.01
Total CHOL/HDL Ratio: 4
Triglycerides: 74 mg/dL (ref 0.0–149.0)
VLDL: 14.8 mg/dL (ref 0.0–40.0)

## 2022-03-03 LAB — CBC WITH DIFFERENTIAL/PLATELET
Basophils Absolute: 0 10*3/uL (ref 0.0–0.1)
Basophils Relative: 0.5 % (ref 0.0–3.0)
Eosinophils Absolute: 0.4 10*3/uL (ref 0.0–0.7)
Eosinophils Relative: 5 % (ref 0.0–5.0)
HCT: 43.4 % (ref 39.0–52.0)
Hemoglobin: 14.2 g/dL (ref 13.0–17.0)
Lymphocytes Relative: 26.8 % (ref 12.0–46.0)
Lymphs Abs: 1.9 10*3/uL (ref 0.7–4.0)
MCHC: 32.7 g/dL (ref 30.0–36.0)
MCV: 91.2 fl (ref 78.0–100.0)
Monocytes Absolute: 0.5 10*3/uL (ref 0.1–1.0)
Monocytes Relative: 7.3 % (ref 3.0–12.0)
Neutro Abs: 4.4 10*3/uL (ref 1.4–7.7)
Neutrophils Relative %: 60.4 % (ref 43.0–77.0)
Platelets: 252 10*3/uL (ref 150.0–400.0)
RBC: 4.75 Mil/uL (ref 4.22–5.81)
RDW: 13.1 % (ref 11.5–15.5)
WBC: 7.2 10*3/uL (ref 4.0–10.5)

## 2022-03-03 LAB — PSA: PSA: 1.23 ng/mL (ref 0.10–4.00)

## 2022-03-03 LAB — HEMOGLOBIN A1C: Hgb A1c MFr Bld: 6.1 % (ref 4.6–6.5)

## 2022-03-03 MED ORDER — VALSARTAN 160 MG PO TABS: 160.0000 mg | ORAL_TABLET | Freq: Every day | ORAL | 0 refills | Status: DC

## 2022-03-03 NOTE — Patient Instructions (Signed)

## 2022-03-03 NOTE — Progress Notes (Signed)
Donald Glass is a 46 y.o. male with the following history as recorded in Epic[employer]:  Patient Active Problem List   Diagnosis Date Noted   Migraine headache 09/05/2016   Essential hypertension 08/16/2016   Routine general medical examination at a health care facility 08/18/2015   Tobacco use disorder, continuous 08/18/2015    Current Outpatient Medications  Medication Sig Dispense Refill   amLODipine (NORVASC) 10 MG tablet Take 1 tablet (10 mg total) by mouth daily. 90 tablet 3   ibuprofen (ADVIL,MOTRIN) 200 MG tablet Take 600 mg by mouth every 6 (six) hours as needed for headache or moderate pain.     valsartan (DIOVAN) 160 MG tablet Take 1 tablet (160 mg total) by mouth daily. 90 tablet 0   No current facility-administered medications for this visit.    Allergies: Other  Past Medical History:  Diagnosis Date   Hypertension    Migraine headache 09/05/2016    Past Surgical History:  Procedure Laterality Date   NO PAST SURGERIES      Family History  Problem Relation Age of Onset   Hypertension Mother    Arthritis Maternal Grandmother    Cancer Paternal Grandmother        breast   Prostate cancer Father    Heart disease Paternal Uncle    Diabetes Paternal Uncle    Stroke Paternal Uncle     Social History   Tobacco Use   Smoking status: Former    Packs/day: 1.00    Years: 20.00    Total pack years: 20.00    Types: Cigarettes   Smokeless tobacco: Never  Substance Use Topics   Alcohol use: Yes    Alcohol/week: 18.0 standard drinks of alcohol    Types: 18 Cans of beer per week    Subjective:   Presents for yearly CPE; no acute concerns today; up to date on dental and vision exams;  Going to [place] for 20th anniversary trip next month- will be reviewing vows;   Review of Systems  Constitutional: Negative.   HENT: Negative.    Eyes: Negative.   Respiratory: Negative.    Cardiovascular: Negative.   Gastrointestinal: Negative.   Genitourinary: Negative.    Musculoskeletal: Negative.   Skin: Negative.   Neurological: Negative.   Endo/Heme/Allergies: Negative.   Psychiatric/Behavioral: Negative.          Objective:  Vitals:   03/03/22 1400 03/03/22 1639  BP: (!) 150/70 (!) 140/88  Pulse: 79   Temp: 97.8 F (36.6 C)   TempSrc: Oral   SpO2: 98%   Weight: 181 lb 6.4 oz (82.3 kg)   Height: '6\' 2"'  (1.88 m)     General: Well developed, well nourished, in no acute distress  Skin : Warm and dry.  Head: Normocephalic and atraumatic  Eyes: Sclera and conjunctiva clear; pupils round and reactive to light; extraocular movements intact  Ears: External normal; canals clear; tympanic membranes normal  Oropharynx: Pink, supple. No suspicious lesions  Neck: Supple without thyromegaly, adenopathy  Lungs: Respirations unlabored; clear to auscultation bilaterally without wheeze, rales, rhonchi  CVS exam: normal rate and regular rhythm.  Abdomen: Soft; nontender; nondistended; normoactive bowel sounds; no masses or hepatosplenomegaly  Musculoskeletal: No deformities; no active joint inflammation  Extremities: No edema, cyanosis, clubbing  Vessels: Symmetric bilaterally  Neurologic: Alert and oriented; speech intact; face symmetrical; moves all extremities well; CNII-XII intact without focal deficit   Assessment:  1. PE (physical exam), annual   2. Hyperlipidemia, unspecified hyperlipidemia type  3. Elevated glucose   4. Prostate cancer screening     Plan:  Age appropriate preventive healthcare needs addressed; encouraged regular eye doctor and dental exams; encouraged regular exercise; will update labs and refills as needed today; follow-up to be determined; Will change Lisinopril 40 mg to Diovan 160 mg; continue Amlodipine 10 mg daily; he will start having his blood pressure checked with provider at his job;    No follow-ups on file.  Orders Placed This Encounter  Procedures   CBC with Differential/Platelet   Comp Met (CMET)   Lipid  panel   Hemoglobin A1c   PSA    Requested Prescriptions   Signed Prescriptions Disp Refills   valsartan (DIOVAN) 160 MG tablet 90 tablet 0    Sig: Take 1 tablet (160 mg total) by mouth daily.

## 2022-03-09 ENCOUNTER — Other Ambulatory Visit: Payer: Self-pay | Admitting: Family

## 2022-03-09 MED ORDER — ATORVASTATIN CALCIUM 20 MG PO TABS: 20.0000 mg | ORAL_TABLET | Freq: Every day | ORAL | 0 refills | Status: DC

## 2022-03-14 ENCOUNTER — Telehealth: Payer: Self-pay

## 2022-03-14 NOTE — Telephone Encounter (Signed)
Caller Name Reyaansh Merlo Caller Phone Number [phone] Call Type Message Only Information Provided Reason for Call Returning a Call from the Office Initial Comment Returning call to the office, pls call back Disp. Time Disposition Final User 03/14/2022 7:50:35 AM General Information Provided Yes Salvatore Marvel Call Closed By: Salvatore Marvel Transaction Date/Time: 03/14/2022 7:49:08 AM (ET)

## 2022-03-14 NOTE — Telephone Encounter (Signed)
I have called pt back with no success. I have also sent him a message via my-chart.

## 2022-04-19 ENCOUNTER — Ambulatory Visit: Payer: 59 | Admitting: Family

## 2022-04-21 ENCOUNTER — Other Ambulatory Visit: Payer: Self-pay

## 2022-04-21 DIAGNOSIS — I10 Essential (primary) hypertension: Secondary | ICD-10-CM

## 2022-04-21 MED ORDER — AMLODIPINE BESYLATE 10 MG PO TABS: 10.0000 mg | ORAL_TABLET | Freq: Every day | ORAL | 0 refills | Status: DC

## 2022-04-21 NOTE — Telephone Encounter (Signed)
Rx sent to pharmacy   

## 2022-05-30 ENCOUNTER — Other Ambulatory Visit: Payer: Self-pay | Admitting: Family

## 2022-06-08 ENCOUNTER — Other Ambulatory Visit: Payer: Self-pay | Admitting: Family

## 2022-06-22 ENCOUNTER — Other Ambulatory Visit: Payer: Self-pay | Admitting: Family

## 2022-07-23 ENCOUNTER — Other Ambulatory Visit: Payer: Self-pay | Admitting: Family

## 2022-07-23 DIAGNOSIS — I10 Essential (primary) hypertension: Secondary | ICD-10-CM

## 2022-08-15 ENCOUNTER — Other Ambulatory Visit: Payer: Self-pay

## 2022-08-26 ENCOUNTER — Telehealth: Payer: Self-pay | Admitting: Family

## 2022-08-26 ENCOUNTER — Other Ambulatory Visit: Payer: Self-pay | Admitting: Family

## 2022-08-26 NOTE — Telephone Encounter (Signed)
Called pt left VM to give our office a call back to discuss his blood pressure.

## 2022-08-26 NOTE — Telephone Encounter (Signed)
Has he been able to get his blood pressure checked through his employer? How is he doing on the Valsartan? If blood pressure still not at goal, will need to increase dosage.

## 2022-08-29 NOTE — Telephone Encounter (Signed)
Called pt and left another VM asking to call the office back to discuss blood pressure.

## 2022-09-01 NOTE — Telephone Encounter (Signed)
Pt called and lvm to return call  Sent pt a mychart message

## 2022-09-02 ENCOUNTER — Other Ambulatory Visit: Payer: Self-pay | Admitting: Family

## 2022-09-02 MED ORDER — VALSARTAN 320 MG PO TABS
320.0000 mg | ORAL_TABLET | Freq: Every day | ORAL | 0 refills | Status: DC
Start: 2022-09-02 — End: 2022-11-30

## 2022-10-21 ENCOUNTER — Encounter: Payer: Self-pay | Admitting: Family

## 2022-10-21 ENCOUNTER — Other Ambulatory Visit: Payer: Self-pay | Admitting: Family

## 2022-10-21 DIAGNOSIS — I10 Essential (primary) hypertension: Secondary | ICD-10-CM

## 2022-11-30 ENCOUNTER — Other Ambulatory Visit: Payer: Self-pay | Admitting: Family

## 2023-01-21 ENCOUNTER — Other Ambulatory Visit: Payer: Self-pay | Admitting: Family

## 2023-01-21 DIAGNOSIS — I10 Essential (primary) hypertension: Secondary | ICD-10-CM

## 2023-02-15 ENCOUNTER — Encounter (INDEPENDENT_AMBULATORY_CARE_PROVIDER_SITE_OTHER): Payer: Self-pay

## 2023-03-07 ENCOUNTER — Encounter: Payer: Self-pay | Admitting: Family

## 2023-03-07 ENCOUNTER — Ambulatory Visit (INDEPENDENT_AMBULATORY_CARE_PROVIDER_SITE_OTHER): Payer: 59 | Admitting: Family

## 2023-03-07 VITALS — BP 126/80 | HR 76 | Temp 98.0°F | Resp 18 | Ht 74.0 in | Wt 182.8 lb

## 2023-03-07 DIAGNOSIS — E785 Hyperlipidemia, unspecified: Secondary | ICD-10-CM

## 2023-03-07 DIAGNOSIS — Z23 Encounter for immunization: Secondary | ICD-10-CM | POA: Diagnosis not present

## 2023-03-07 DIAGNOSIS — Z125 Encounter for screening for malignant neoplasm of prostate: Secondary | ICD-10-CM

## 2023-03-07 DIAGNOSIS — I10 Essential (primary) hypertension: Secondary | ICD-10-CM

## 2023-03-07 DIAGNOSIS — R7309 Other abnormal glucose: Secondary | ICD-10-CM | POA: Diagnosis not present

## 2023-03-07 DIAGNOSIS — Z1159 Encounter for screening for other viral diseases: Secondary | ICD-10-CM

## 2023-03-07 DIAGNOSIS — Z0000 Encounter for general adult medical examination without abnormal findings: Secondary | ICD-10-CM

## 2023-03-07 LAB — LIPID PANEL
Cholesterol: 242 mg/dL — ABNORMAL HIGH (ref 0–200)
HDL: 49.2 mg/dL (ref >39.00)
LDL Cholesterol: 170 mg/dL — ABNORMAL HIGH (ref 0–99)
NonHDL: 192.82
Total CHOL/HDL Ratio: 5
Triglycerides: 113 mg/dL (ref 0.0–149.0)
VLDL: 22.6 mg/dL (ref 0.0–40.0)

## 2023-03-07 LAB — COMPREHENSIVE METABOLIC PANEL
ALT: 24 U/L (ref 0–53)
AST: 24 U/L (ref 0–37)
Albumin: 4.5 g/dL (ref 3.5–5.2)
Alkaline Phosphatase: 51 U/L (ref 39–117)
BUN: 12 mg/dL (ref 6–23)
CO2: 30 mEq/L (ref 19–32)
Calcium: 9.8 mg/dL (ref 8.4–10.5)
Chloride: 100 mEq/L (ref 96–112)
Creatinine, Ser: 1.03 mg/dL (ref 0.40–1.50)
GFR: 85.9 mL/min (ref >60.00)
Glucose, Bld: 110 mg/dL — ABNORMAL HIGH (ref 70–99)
Potassium: 5 mEq/L (ref 3.5–5.1)
Sodium: 136 mEq/L (ref 135–145)
Total Bilirubin: 0.5 mg/dL (ref 0.2–1.2)
Total Protein: 7.3 g/dL (ref 6.0–8.3)

## 2023-03-07 LAB — HEMOGLOBIN A1C: Hgb A1c MFr Bld: 6 % (ref 4.6–6.5)

## 2023-03-07 LAB — CBC WITH DIFFERENTIAL/PLATELET
Basophils Absolute: 0 10*3/uL (ref 0.0–0.1)
Basophils Relative: 0.5 % (ref 0.0–3.0)
Eosinophils Absolute: 0.1 10*3/uL (ref 0.0–0.7)
Eosinophils Relative: 1.8 % (ref 0.0–5.0)
HCT: 48.1 % (ref 39.0–52.0)
Hemoglobin: 15.5 g/dL (ref 13.0–17.0)
Lymphocytes Relative: 34.2 % (ref 12.0–46.0)
Lymphs Abs: 1.8 10*3/uL (ref 0.7–4.0)
MCHC: 32.2 g/dL (ref 30.0–36.0)
MCV: 90.8 fl (ref 78.0–100.0)
Monocytes Absolute: 0.6 10*3/uL (ref 0.1–1.0)
Monocytes Relative: 11.2 % (ref 3.0–12.0)
Neutro Abs: 2.7 10*3/uL (ref 1.4–7.7)
Neutrophils Relative %: 52.3 % (ref 43.0–77.0)
Platelets: 266 10*3/uL (ref 150.0–400.0)
RBC: 5.3 Mil/uL (ref 4.22–5.81)
RDW: 13 % (ref 11.5–15.5)
WBC: 5.2 10*3/uL (ref 4.0–10.5)

## 2023-03-07 LAB — PSA: PSA: 2.13 ng/mL (ref 0.10–4.00)

## 2023-03-07 MED ORDER — VALSARTAN 320 MG PO TABS: 320.0000 mg | ORAL_TABLET | Freq: Every day | ORAL | 3 refills | Status: DC

## 2023-03-07 MED ORDER — AMLODIPINE BESYLATE 10 MG PO TABS
10.0000 mg | ORAL_TABLET | Freq: Every day | ORAL | 3 refills | Status: DC
Start: 2023-03-07 — End: 2024-03-14

## 2023-03-07 NOTE — Progress Notes (Signed)
Donald Glass is a 47 y.o. male with the following history as recorded in Epic[facility]:  Patient Active Problem List   Diagnosis Date Noted   Migraine headache 09/05/2016   Essential hypertension 08/16/2016   Routine general medical examination at a health care facility 08/18/2015   Tobacco use disorder, continuous 08/18/2015    Current Outpatient Medications  Medication Sig Dispense Refill   ibuprofen (ADVIL,MOTRIN) 200 MG tablet Take 600 mg by mouth every 6 (six) hours as needed for headache or moderate pain.     amLODipine (NORVASC) 10 MG tablet Take 1 tablet (10 mg total) by mouth daily. 90 tablet 3   valsartan (DIOVAN) 320 MG tablet Take 1 tablet (320 mg total) by mouth daily. 90 tablet 3   No current facility-administered medications for this visit.    Allergies: Shellfish allergy  Past Medical History:  Diagnosis Date   Hypertension    Migraine headache 09/05/2016    Past Surgical History:  Procedure Laterality Date   NO PAST SURGERIES      Family History  Problem Relation Age of Onset   Hypertension Mother    Arthritis Maternal Grandmother    Cancer Paternal Grandmother        breast   Prostate cancer Father    Heart disease Paternal Uncle    Diabetes Paternal Uncle    Stroke Paternal Uncle     Social History   Tobacco Use   Smoking status: Former    Current packs/day: 1.00    Average packs/day: 1 pack/day for 20.0 years (20.0 ttl pk-yrs)    Types: Cigarettes   Smokeless tobacco: Never  Substance Use Topics   Alcohol use: Yes    Alcohol/week: 18.0 standard drinks of alcohol    Types: 18 Cans of beer per week    Subjective:   Presents for yearly CPE; no acute concerns today; has been making changes to his diet- limiting pork intake and in general, "just feeling very good."  Review of Systems  Constitutional: Negative.   HENT: Negative.    Eyes: Negative.   Respiratory: Negative.    Cardiovascular: Negative.   Gastrointestinal: Negative.    Genitourinary: Negative.   Musculoskeletal: Negative.   Skin: Negative.   Neurological: Negative.   Endo/Heme/Allergies: Negative.   Psychiatric/Behavioral: Negative.       Objective:  Vitals:   03/07/23 0852  BP: 126/80  Pulse: 76  Resp: 18  Temp: 98 F (36.7 C)  TempSrc: Temporal  SpO2: 98%  Weight: 182 lb 12.8 oz (82.9 kg)  Height: 6\' 2"  (1.88 m)    General: Well developed, well nourished, in no acute distress  Skin : Warm and dry.  Head: Normocephalic and atraumatic  Eyes: Sclera and conjunctiva clear; pupils round and reactive to light; extraocular movements intact  Ears: External normal; canals clear; tympanic membranes normal  Oropharynx: Pink, supple. No suspicious lesions  Neck: Supple without thyromegaly, adenopathy  Lungs: Respirations unlabored; clear to auscultation bilaterally without wheeze, rales, rhonchi  CVS exam: normal rate and regular rhythm.  Abdomen: Soft; nontender; nondistended; normoactive bowel sounds; no masses or hepatosplenomegaly  Musculoskeletal: No deformities; no active joint inflammation  Extremities: No edema, cyanosis, clubbing  Vessels: Symmetric bilaterally  Neurologic: Alert and oriented; speech intact; face symmetrical; moves all extremities well; CNII-XII intact without focal deficit   Assessment:  1. PE (physical exam), annual   2. Hyperlipidemia, unspecified hyperlipidemia type   3. Prostate cancer screening   4. Elevated glucose   5. Need  for hepatitis C screening test   6. Essential hypertension   7. Need for Tdap vaccination     Plan:  Age appropriate preventive healthcare needs addressed; encouraged regular eye doctor and dental exams; encouraged regular exercise; will update labs and refills as needed today;  Patient did not want to start statin last year- discussed coronary calcium CT as option to further evaluate his lipids; will discuss after seeing his results from labs today and decide about follow up;    No  follow-ups on file.  Orders Placed This Encounter  Procedures   Tdap vaccine greater than or equal to 7yo IM   CBC with Differential/Platelet   Comp Met (CMET)   Lipid panel   PSA   Hemoglobin A1c   Hepatitis C Antibody    Requested Prescriptions   Signed Prescriptions Disp Refills   amLODipine (NORVASC) 10 MG tablet 90 tablet 3    Sig: Take 1 tablet (10 mg total) by mouth daily.   valsartan (DIOVAN) 320 MG tablet 90 tablet 3    Sig: Take 1 tablet (320 mg total) by mouth daily.

## 2023-03-08 ENCOUNTER — Telehealth: Payer: Self-pay

## 2023-03-08 ENCOUNTER — Other Ambulatory Visit: Payer: Self-pay | Admitting: Family

## 2023-03-08 DIAGNOSIS — E785 Hyperlipidemia, unspecified: Secondary | ICD-10-CM

## 2023-03-08 LAB — HEPATITIS C ANTIBODY: Hepatitis C Ab: NONREACTIVE

## 2023-03-08 NOTE — Telephone Encounter (Signed)
Called pt and left a VM asking pt to call the office back to get an OV scheduled.

## 2024-03-14 ENCOUNTER — Ambulatory Visit (INDEPENDENT_AMBULATORY_CARE_PROVIDER_SITE_OTHER): Admitting: Family

## 2024-03-14 ENCOUNTER — Encounter: Payer: Self-pay | Admitting: Family

## 2024-03-14 VITALS — BP 130/82 | HR 62 | Ht 74.0 in | Wt 179.6 lb

## 2024-03-14 DIAGNOSIS — Z13220 Encounter for screening for lipoid disorders: Secondary | ICD-10-CM

## 2024-03-14 DIAGNOSIS — Z1211 Encounter for screening for malignant neoplasm of colon: Secondary | ICD-10-CM | POA: Diagnosis not present

## 2024-03-14 DIAGNOSIS — Z0000 Encounter for general adult medical examination without abnormal findings: Secondary | ICD-10-CM

## 2024-03-14 DIAGNOSIS — Z125 Encounter for screening for malignant neoplasm of prostate: Secondary | ICD-10-CM

## 2024-03-14 DIAGNOSIS — I10 Essential (primary) hypertension: Secondary | ICD-10-CM

## 2024-03-14 LAB — LIPID PANEL
Cholesterol: 223 mg/dL — ABNORMAL HIGH (ref 0–200)
HDL: 61.1 mg/dL (ref >39.00)
LDL Cholesterol: 137 mg/dL — ABNORMAL HIGH (ref 0–99)
NonHDL: 161.51
Total CHOL/HDL Ratio: 4
Triglycerides: 123 mg/dL (ref 0.0–149.0)
VLDL: 24.6 mg/dL (ref 0.0–40.0)

## 2024-03-14 LAB — CBC WITH DIFFERENTIAL/PLATELET
Basophils Absolute: 0.1 K/uL (ref 0.0–0.1)
Basophils Relative: 2 % (ref 0.0–3.0)
Eosinophils Absolute: 0.1 K/uL (ref 0.0–0.7)
Eosinophils Relative: 2.8 % (ref 0.0–5.0)
HCT: 49 % (ref 39.0–52.0)
Hemoglobin: 16 g/dL (ref 13.0–17.0)
Lymphocytes Relative: 29.4 % (ref 12.0–46.0)
Lymphs Abs: 1.5 K/uL (ref 0.7–4.0)
MCHC: 32.7 g/dL (ref 30.0–36.0)
MCV: 89.5 fl (ref 78.0–100.0)
Monocytes Absolute: 0.4 K/uL (ref 0.1–1.0)
Monocytes Relative: 7.9 % (ref 3.0–12.0)
Neutro Abs: 2.9 K/uL (ref 1.4–7.7)
Neutrophils Relative %: 57.9 % (ref 43.0–77.0)
Platelets: 286 K/uL (ref 150.0–400.0)
RBC: 5.47 Mil/uL (ref 4.22–5.81)
RDW: 12.7 % (ref 11.5–15.5)
WBC: 5 K/uL (ref 4.0–10.5)

## 2024-03-14 LAB — COMPREHENSIVE METABOLIC PANEL WITH GFR
ALT: 25 U/L (ref 0–53)
AST: 28 U/L (ref 0–37)
Albumin: 4.7 g/dL (ref 3.5–5.2)
Alkaline Phosphatase: 53 U/L (ref 39–117)
BUN: 11 mg/dL (ref 6–23)
CO2: 30 meq/L (ref 19–32)
Calcium: 9.7 mg/dL (ref 8.4–10.5)
Chloride: 99 meq/L (ref 96–112)
Creatinine, Ser: 0.92 mg/dL (ref 0.40–1.50)
GFR: 97.67 mL/min (ref >60.00)
Glucose, Bld: 96 mg/dL (ref 70–99)
Potassium: 4.9 meq/L (ref 3.5–5.1)
Sodium: 136 meq/L (ref 135–145)
Total Bilirubin: 0.6 mg/dL (ref 0.2–1.2)
Total Protein: 7.7 g/dL (ref 6.0–8.3)

## 2024-03-14 LAB — HEMOGLOBIN A1C: Hgb A1c MFr Bld: 6.4 % (ref 4.6–6.5)

## 2024-03-14 LAB — PSA: PSA: 1.7 ng/mL (ref 0.10–4.00)

## 2024-03-14 MED ORDER — AMLODIPINE BESYLATE 10 MG PO TABS
10.0000 mg | ORAL_TABLET | Freq: Every day | ORAL | 3 refills | Status: AC
Start: 2024-03-14 — End: ?

## 2024-03-14 MED ORDER — VALSARTAN 320 MG PO TABS: 320.0000 mg | ORAL_TABLET | Freq: Every day | ORAL | 3 refills | Status: AC

## 2024-03-14 NOTE — Progress Notes (Signed)
 Donald Glass is a 48 y.o. male with the following history as recorded in Epic[facility]:  Patient Active Problem List   Diagnosis Date Noted   Migraine headache 09/05/2016   Essential hypertension 08/16/2016   Routine general medical examination at a health care facility 08/18/2015   Tobacco use disorder, continuous 08/18/2015    Current Outpatient Medications  Medication Sig Dispense Refill   ibuprofen (ADVIL,MOTRIN) 200 MG tablet Take 600 mg by mouth every 6 (six) hours as needed for headache or moderate pain.     amLODipine  (NORVASC ) 10 MG tablet Take 1 tablet (10 mg total) by mouth daily. 90 tablet 3   valsartan  (DIOVAN ) 320 MG tablet Take 1 tablet (320 mg total) by mouth daily. 90 tablet 3   No current facility-administered medications for this visit.    Allergies: Shellfish allergy  Past Medical History:  Diagnosis Date   Hypertension    Migraine headache 09/05/2016    Past Surgical History:  Procedure Laterality Date   NO PAST SURGERIES      Family History  Problem Relation Age of Onset   Hypertension Mother    Arthritis Maternal Grandmother    Cancer Paternal Grandmother        breast   Prostate cancer Father    Heart disease Paternal Uncle    Diabetes Paternal Uncle    Stroke Paternal Uncle     Social History   Tobacco Use   Smoking status: Former    Current packs/day: 1.00    Average packs/day: 1 pack/day for 20.0 years (20.0 ttl pk-yrs)    Types: Cigarettes   Smokeless tobacco: Never  Substance Use Topics   Alcohol use: Yes    Alcohol/week: 18.0 standard drinks of alcohol    Types: 18 Cans of beer per week    Subjective:   Presents for yearly CPE; no acute concerns today; Denies any chest pain, shortness of breath, blurred vision or headache  Review of Systems  Constitutional: Negative.   HENT: Negative.    Eyes: Negative.   Respiratory: Negative.    Cardiovascular: Negative.   Gastrointestinal: Negative.   Genitourinary: Negative.    Musculoskeletal: Negative.   Skin: Negative.   Neurological: Negative.   Endo/Heme/Allergies: Negative.   Psychiatric/Behavioral: Negative.        Objective:  Vitals:   03/14/24 1010 03/14/24 1038  BP: (!) 146/88 130/82  Pulse: 62   TempSrc: Oral   SpO2: 96%   Weight: 179 lb 9.6 oz (81.5 kg)   Height: 6' 2 (1.88 m)     General: Well developed, well nourished, in no acute distress  Skin : Warm and dry.  Head: Normocephalic and atraumatic  Eyes: Sclera and conjunctiva clear; pupils round and reactive to light; extraocular movements intact  Ears: External normal; canals clear; tympanic membranes normal  Oropharynx: Pink, supple. No suspicious lesions  Neck: Supple without thyromegaly, adenopathy  Lungs: Respirations unlabored; clear to auscultation bilaterally without wheeze, rales, rhonchi  CVS exam: normal rate and regular rhythm.  Abdomen: Soft; nontender; nondistended; normoactive bowel sounds; no masses or hepatosplenomegaly  Musculoskeletal: No deformities; no active joint inflammation  Extremities: No edema, cyanosis, clubbing  Vessels: Symmetric bilaterally  Neurologic: Alert and oriented; speech intact; face symmetrical; moves all extremities well; CNII-XII intact without focal deficit   Assessment:  1. PE (physical exam), annual   2. Colon cancer screening   3. Essential hypertension   4. Lipid screening   5. Prostate cancer screening     Plan:  Age appropriate preventive healthcare needs addressed; encouraged regular eye doctor and dental exams; encouraged regular exercise; will update labs and refills as needed today; follow-up in 1 year, sooner prn.    No follow-ups on file.  Orders Placed This Encounter  Procedures   Cologuard   CBC with Differential/Platelet   Comp Met (CMET)   Lipid panel   PSA   Hemoglobin A1c    Requested Prescriptions   Signed Prescriptions Disp Refills   amLODipine  (NORVASC ) 10 MG tablet 90 tablet 3    Sig: Take 1 tablet  (10 mg total) by mouth daily.   valsartan  (DIOVAN ) 320 MG tablet 90 tablet 3    Sig: Take 1 tablet (320 mg total) by mouth daily.

## 2024-03-14 NOTE — Patient Instructions (Signed)
 [place] [facility] [address] [place], [place] [address] Phone: [phone]

## 2024-03-15 ENCOUNTER — Ambulatory Visit: Payer: Self-pay | Admitting: Family

## 2024-03-29 LAB — COLOGUARD: COLOGUARD: NEGATIVE
